# Patient Record
Sex: Male | Born: 1970 | Race: White | Hispanic: No | Marital: Married | Smoking: Current every day smoker
Health system: Southern US, Community
[De-identification: ages and names within clinical notes are randomized; demographics above are authoritative.]

## PROBLEM LIST (undated history)

## (undated) DIAGNOSIS — I10 Essential (primary) hypertension: Secondary | ICD-10-CM

## (undated) HISTORY — DX: Essential (primary) hypertension: I10

## (undated) HISTORY — PX: WISDOM TOOTH EXTRACTION: SHX21

---

## 2003-03-01 ENCOUNTER — Encounter: Payer: Self-pay | Admitting: Internal Medicine

## 2003-03-01 ENCOUNTER — Encounter: Admission: RE | Admit: 2003-03-01 | Discharge: 2003-03-01 | Payer: Self-pay | Admitting: Internal Medicine

## 2003-09-14 ENCOUNTER — Encounter: Admission: RE | Admit: 2003-09-14 | Discharge: 2003-09-14 | Payer: Self-pay | Admitting: Internal Medicine

## 2005-04-08 IMAGING — US US SCROTUM
1 series · 14 of 25 positions shown · non-contrast
Comparison: none

CLINICAL DATA: Testicular pain. 
 ULTRASOUND OF THE SCROTUM
 Scans over the scrotum were performed.  Both testicles are normal in size and in echogenicity.  The right testicle measures 4.6 x 2.4 x 3.2 cm with the left testicle measuring 4.6 x 2.1 x 2.9 cm.  Blood flow is demonstrated to both testicles.  Small cysts are noted in the right epididymis of no more than 4 mm in maximum diameter.  A single small left epididymal cyst is present of 4 mm as well.  A small amount of fluid is noted bilaterally.  No varicocele is seen.
 IMPRESSION
 Small epididymal cysts are noted, right more numerous than left.  Both testicles are normal in size and in echogenicity.

[Series 1: unknown · 0.09mm/px · 14 of 52 slices shown]
[im 1/52]
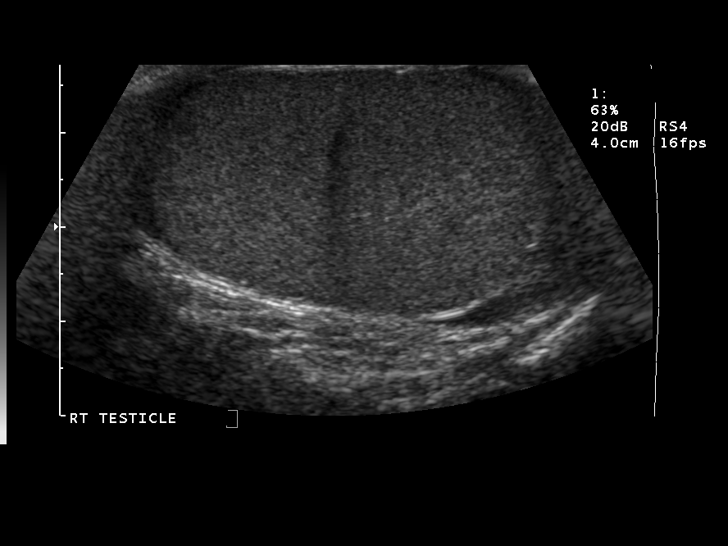
[im 5/52]
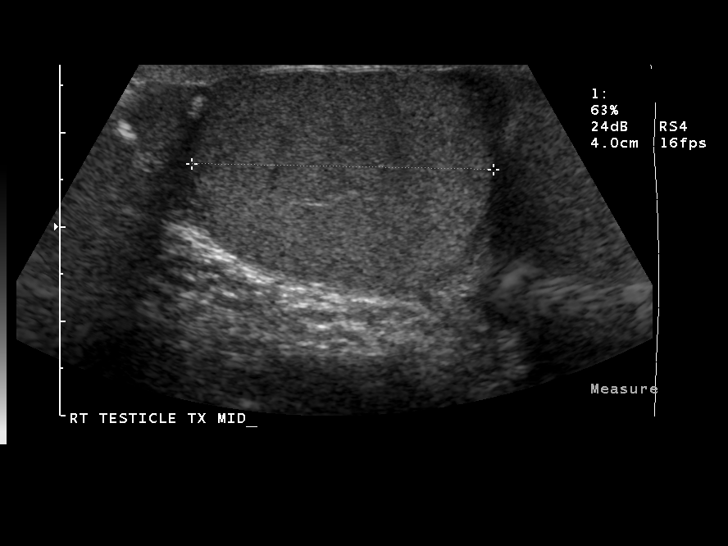
[im 9/52]
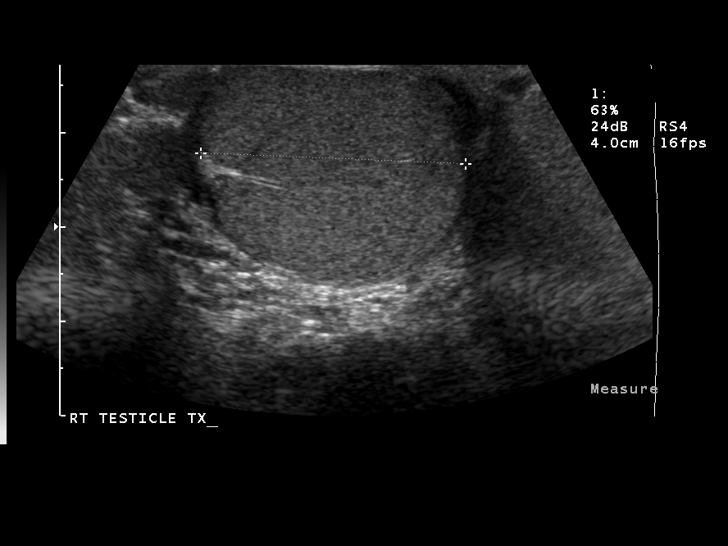
[im 13/52]
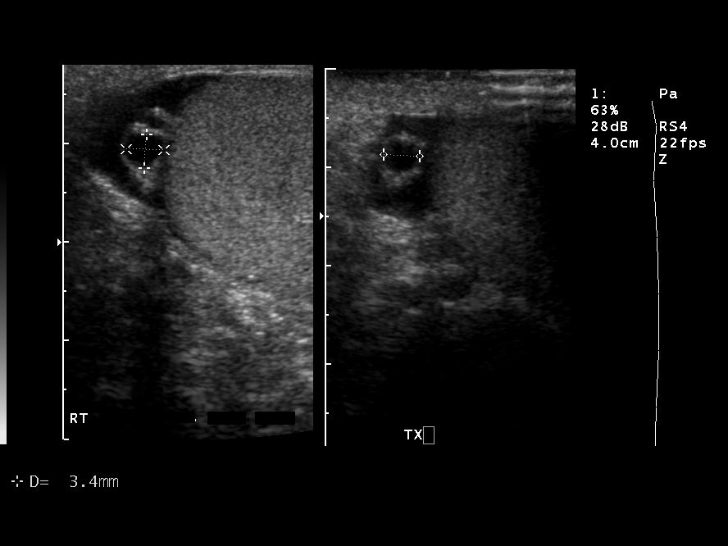
[im 18/52]
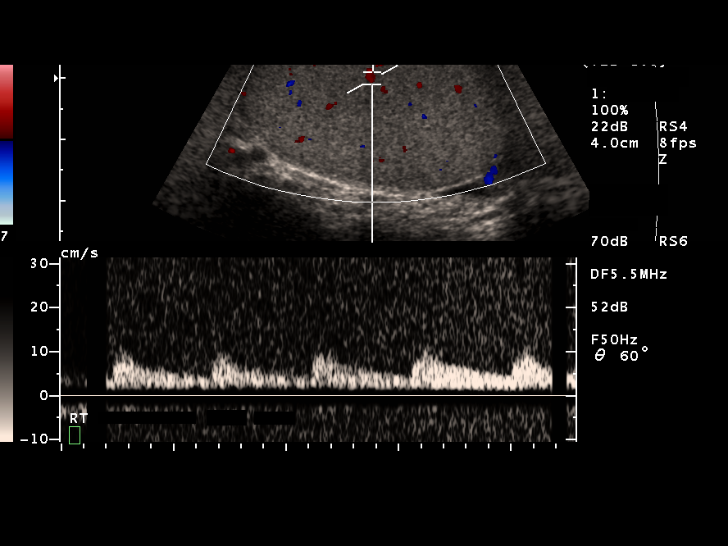
[im 20/52]
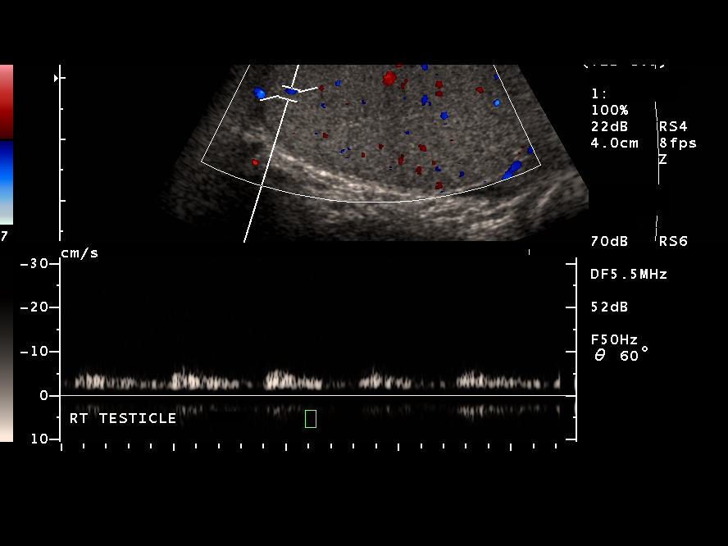
[im 24/52]
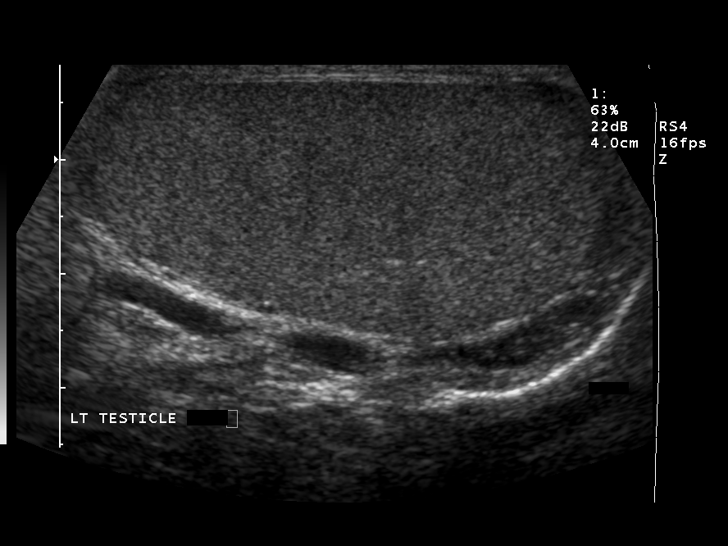
[im 28/52]
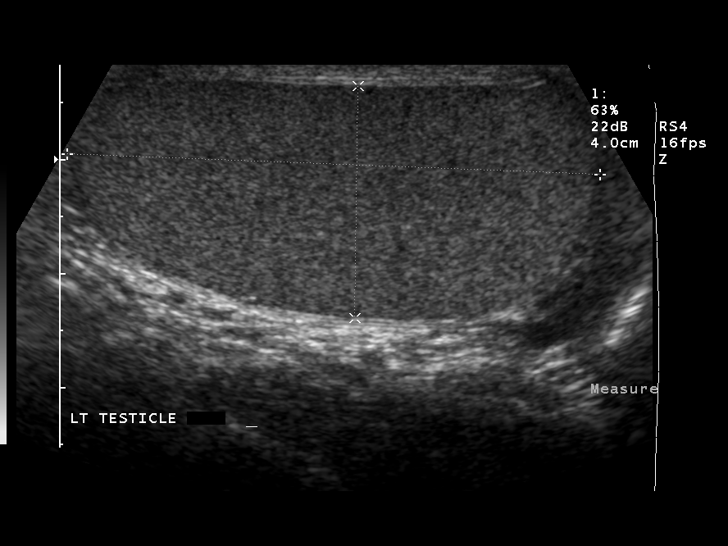
[im 32/52]
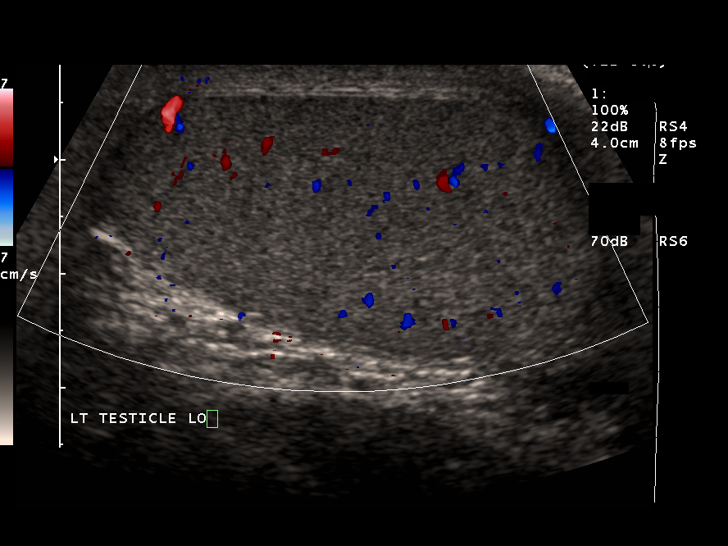
[im 35/52]
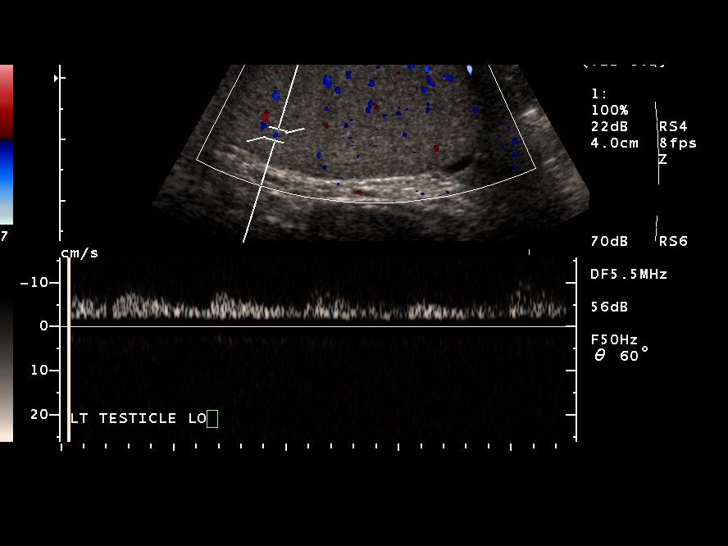
[im 39/52]
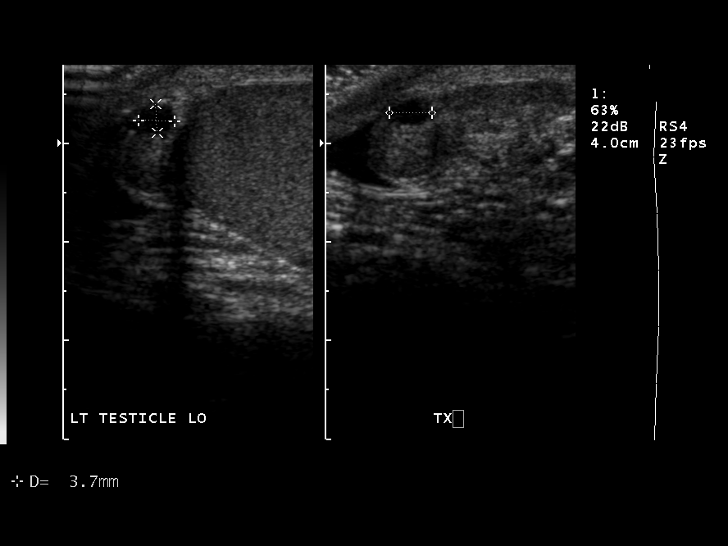
[im 43/52]
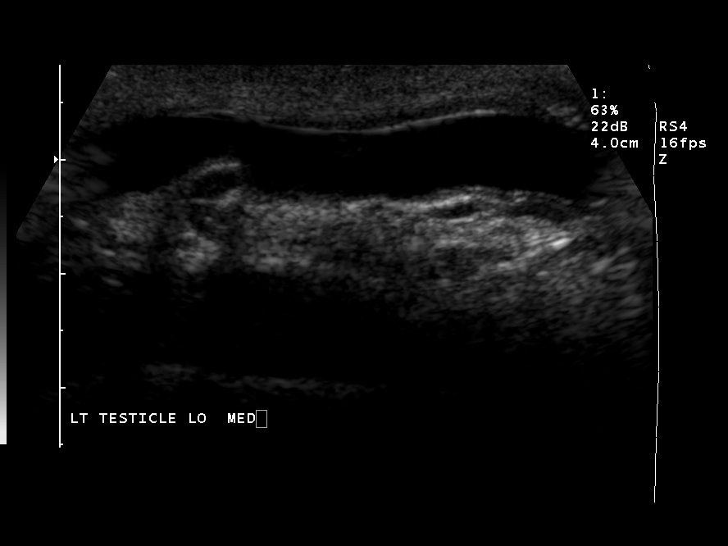
[im 47/52]
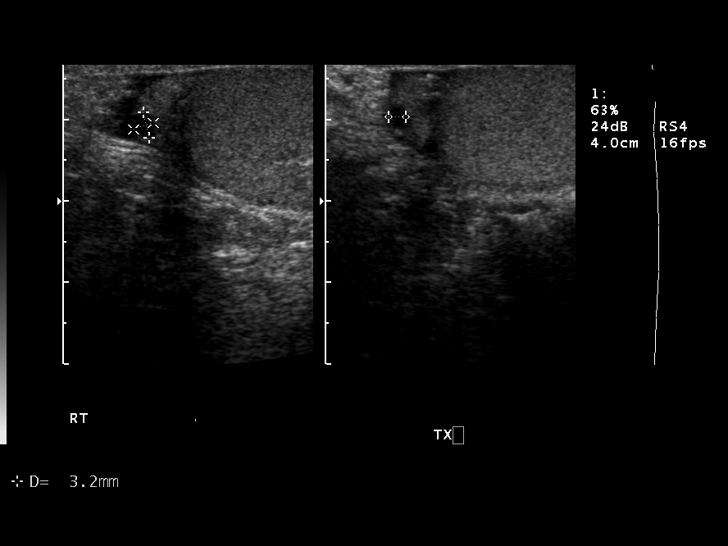
[im 52/52]
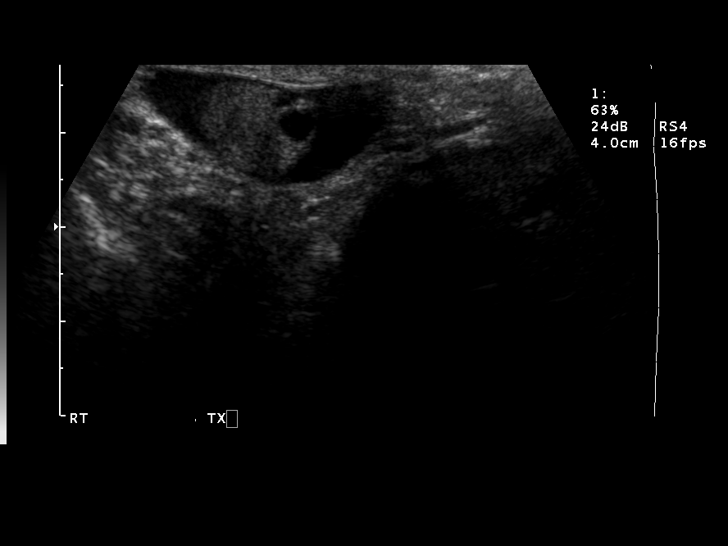

[14 of 25 positions shown; findings below may reference images not displayed]

## 2011-02-26 ENCOUNTER — Ambulatory Visit (HOSPITAL_COMMUNITY)
Admission: RE | Admit: 2011-02-26 | Discharge: 2011-02-26 | Disposition: A | Discharge status: Home / Self Care | Payer: BC Managed Care – PPO | Source: Ambulatory Visit | Attending: Gastroenterology | Admitting: Gastroenterology

## 2011-02-26 ENCOUNTER — Other Ambulatory Visit: Payer: Self-pay | Admitting: Gastroenterology

## 2011-02-26 DIAGNOSIS — D126 Benign neoplasm of colon, unspecified: Secondary | ICD-10-CM | POA: Insufficient documentation

## 2011-02-26 DIAGNOSIS — Z800 Family history of malignant neoplasm of digestive organs: Secondary | ICD-10-CM | POA: Insufficient documentation

## 2011-02-26 DIAGNOSIS — Z1211 Encounter for screening for malignant neoplasm of colon: Secondary | ICD-10-CM | POA: Insufficient documentation

## 2011-02-26 DIAGNOSIS — D129 Benign neoplasm of anus and anal canal: Secondary | ICD-10-CM | POA: Insufficient documentation

## 2011-02-26 DIAGNOSIS — D128 Benign neoplasm of rectum: Secondary | ICD-10-CM | POA: Insufficient documentation

## 2011-02-27 NOTE — Op Note (Signed)
  NAME:  WAH, SABIC.:  [id number]  MEDICAL RECORD NO.:  [id number]  LOCATION:  [facility]                         FACILITY:  [facility]  PHYSICIAN:  Danise Edge, M.D.   DATE OF BIRTH:  [birth year]  DATE OF PROCEDURE: DATE OF DISCHARGE:                              OPERATIVE REPORT   REFERRING PHYSICIAN:  Georgann Housekeeper, MD  HISTORY:  [name] is a 40 year old male, born on Mar 24, 1971. The patient's father was diagnosed with colon cancer under age 62.  The patient is scheduled to undergo his first screening colonoscopy with polypectomy to prevent colon cancer.  ENDOSCOPIST:  Danise Edge, M.D.  PREMEDICATIONS: 1. Fentanyl 100 mcg. 2. Versed 10 mg.  DESCRIPTION OF PROCEDURE:  The patient was placed in the left lateral decubitus position.  Anal inspection and digital rectal exam were normal.  The Pentax pediatric colonoscope was introduced into the rectum, and easily advanced to the cecum.  Normal-appearing ileocecal valve and appendiceal orifice were identified.  Colonic preparation for the exam today was good.  Rectum:  Two 4 mm sessile polyps were removed from the rectum with the cold snare, and submitted for pathological interpretation.  Retroflexed view of the distal rectum was normal.  Sigmoid colon:  Normal.  Descending colon:  From the proximal descending colon, a 3 mm sessile polyp was removed with the cold biopsy forceps.  From the distal descending colon, a 5 mm sessile polyp was removed with the cold snare.  Splenic flexure:  Normal.  Transverse colon:  Normal.  Hepatic flexure:  Normal.  Ascending colon:  Normal.  Cecum and ileocecal valve:  Normal.  ASSESSMENT:  Two small polyps were removed from the rectum, and two small polyps were removed from the descending colon.  All polyps were submitted in 1 bottle for pathological evaluation.  RECOMMENDATIONS:  Repeat colonoscopy in 5 years.     ______________________________ Danise Edge, M.D.     MJ/MEDQ  D:  02/26/2011  T:  02/26/2011  Job:  [id number]  cc:   Georgann Housekeeper, MD Fax: [phone]  Electronically Signed by Danise Edge M.D. on 02/27/2011 04:04:03 PM

## 2016-08-18 DIAGNOSIS — R6889 Other general symptoms and signs: Secondary | ICD-10-CM | POA: Diagnosis not present

## 2016-08-18 DIAGNOSIS — J101 Influenza due to other identified influenza virus with other respiratory manifestations: Secondary | ICD-10-CM | POA: Diagnosis not present

## 2016-10-19 DIAGNOSIS — Z0000 Encounter for general adult medical examination without abnormal findings: Secondary | ICD-10-CM | POA: Diagnosis not present

## 2016-10-19 DIAGNOSIS — I10 Essential (primary) hypertension: Secondary | ICD-10-CM | POA: Diagnosis not present

## 2016-10-19 DIAGNOSIS — Z23 Encounter for immunization: Secondary | ICD-10-CM | POA: Diagnosis not present

## 2016-10-19 DIAGNOSIS — Z1389 Encounter for screening for other disorder: Secondary | ICD-10-CM | POA: Diagnosis not present

## 2016-11-16 DIAGNOSIS — Z86010 Personal history of colonic polyps: Secondary | ICD-10-CM | POA: Diagnosis not present

## 2016-11-16 DIAGNOSIS — I10 Essential (primary) hypertension: Secondary | ICD-10-CM | POA: Diagnosis not present

## 2016-11-16 DIAGNOSIS — Z800 Family history of malignant neoplasm of digestive organs: Secondary | ICD-10-CM | POA: Diagnosis not present

## 2017-04-20 DIAGNOSIS — F17210 Nicotine dependence, cigarettes, uncomplicated: Secondary | ICD-10-CM | POA: Diagnosis not present

## 2017-04-20 DIAGNOSIS — I10 Essential (primary) hypertension: Secondary | ICD-10-CM | POA: Diagnosis not present

## 2017-04-20 DIAGNOSIS — M109 Gout, unspecified: Secondary | ICD-10-CM | POA: Diagnosis not present

## 2017-04-20 DIAGNOSIS — Z23 Encounter for immunization: Secondary | ICD-10-CM | POA: Diagnosis not present

## 2017-10-11 DIAGNOSIS — Z01818 Encounter for other preprocedural examination: Secondary | ICD-10-CM | POA: Diagnosis not present

## 2017-10-11 DIAGNOSIS — Z86010 Personal history of colonic polyps: Secondary | ICD-10-CM | POA: Diagnosis not present

## 2017-10-20 DIAGNOSIS — I10 Essential (primary) hypertension: Secondary | ICD-10-CM | POA: Diagnosis not present

## 2017-10-20 DIAGNOSIS — Z136 Encounter for screening for cardiovascular disorders: Secondary | ICD-10-CM | POA: Diagnosis not present

## 2017-10-20 DIAGNOSIS — F17210 Nicotine dependence, cigarettes, uncomplicated: Secondary | ICD-10-CM | POA: Diagnosis not present

## 2017-10-20 DIAGNOSIS — Z0000 Encounter for general adult medical examination without abnormal findings: Secondary | ICD-10-CM | POA: Diagnosis not present

## 2017-10-20 DIAGNOSIS — Z125 Encounter for screening for malignant neoplasm of prostate: Secondary | ICD-10-CM | POA: Diagnosis not present

## 2017-10-20 DIAGNOSIS — Z1389 Encounter for screening for other disorder: Secondary | ICD-10-CM | POA: Diagnosis not present

## 2017-12-16 DIAGNOSIS — Z86010 Personal history of colonic polyps: Secondary | ICD-10-CM | POA: Diagnosis not present

## 2017-12-16 DIAGNOSIS — D126 Benign neoplasm of colon, unspecified: Secondary | ICD-10-CM | POA: Diagnosis not present

## 2017-12-16 DIAGNOSIS — K635 Polyp of colon: Secondary | ICD-10-CM | POA: Diagnosis not present

## 2017-12-21 DIAGNOSIS — D126 Benign neoplasm of colon, unspecified: Secondary | ICD-10-CM | POA: Diagnosis not present

## 2018-05-31 DIAGNOSIS — F17210 Nicotine dependence, cigarettes, uncomplicated: Secondary | ICD-10-CM | POA: Diagnosis not present

## 2018-05-31 DIAGNOSIS — Z23 Encounter for immunization: Secondary | ICD-10-CM | POA: Diagnosis not present

## 2018-05-31 DIAGNOSIS — I10 Essential (primary) hypertension: Secondary | ICD-10-CM | POA: Diagnosis not present

## 2018-08-08 DIAGNOSIS — Z111 Encounter for screening for respiratory tuberculosis: Secondary | ICD-10-CM | POA: Diagnosis not present

## 2018-08-14 DIAGNOSIS — J069 Acute upper respiratory infection, unspecified: Secondary | ICD-10-CM | POA: Diagnosis not present

## 2018-08-20 DIAGNOSIS — J22 Unspecified acute lower respiratory infection: Secondary | ICD-10-CM | POA: Diagnosis not present

## 2018-08-20 DIAGNOSIS — J0100 Acute maxillary sinusitis, unspecified: Secondary | ICD-10-CM | POA: Diagnosis not present

## 2018-09-05 DIAGNOSIS — K219 Gastro-esophageal reflux disease without esophagitis: Secondary | ICD-10-CM | POA: Diagnosis not present

## 2018-09-05 DIAGNOSIS — R0981 Nasal congestion: Secondary | ICD-10-CM | POA: Diagnosis not present

## 2018-10-25 DIAGNOSIS — I10 Essential (primary) hypertension: Secondary | ICD-10-CM | POA: Diagnosis not present

## 2018-10-25 DIAGNOSIS — F17210 Nicotine dependence, cigarettes, uncomplicated: Secondary | ICD-10-CM | POA: Diagnosis not present

## 2018-10-25 DIAGNOSIS — K219 Gastro-esophageal reflux disease without esophagitis: Secondary | ICD-10-CM | POA: Diagnosis not present

## 2019-07-11 DIAGNOSIS — Z20828 Contact with and (suspected) exposure to other viral communicable diseases: Secondary | ICD-10-CM | POA: Diagnosis not present

## 2019-07-27 DIAGNOSIS — Z20828 Contact with and (suspected) exposure to other viral communicable diseases: Secondary | ICD-10-CM | POA: Diagnosis not present

## 2019-08-01 DIAGNOSIS — M109 Gout, unspecified: Secondary | ICD-10-CM | POA: Diagnosis not present

## 2019-08-01 DIAGNOSIS — Z125 Encounter for screening for malignant neoplasm of prostate: Secondary | ICD-10-CM | POA: Diagnosis not present

## 2019-08-01 DIAGNOSIS — Z13220 Encounter for screening for lipoid disorders: Secondary | ICD-10-CM | POA: Diagnosis not present

## 2019-08-01 DIAGNOSIS — Z0000 Encounter for general adult medical examination without abnormal findings: Secondary | ICD-10-CM | POA: Diagnosis not present

## 2019-08-07 ENCOUNTER — Ambulatory Visit
Admission: RE | Admit: 2019-08-07 | Discharge: 2019-08-07 | Disposition: A | Discharge status: Home / Self Care | Payer: BC Managed Care – PPO | Source: Ambulatory Visit | Attending: Internal Medicine | Admitting: Internal Medicine

## 2019-08-07 ENCOUNTER — Other Ambulatory Visit: Payer: Self-pay | Admitting: Internal Medicine

## 2019-08-07 DIAGNOSIS — F17210 Nicotine dependence, cigarettes, uncomplicated: Secondary | ICD-10-CM | POA: Diagnosis not present

## 2019-09-06 DIAGNOSIS — F17210 Nicotine dependence, cigarettes, uncomplicated: Secondary | ICD-10-CM | POA: Diagnosis not present

## 2019-09-06 DIAGNOSIS — I10 Essential (primary) hypertension: Secondary | ICD-10-CM | POA: Diagnosis not present

## 2019-11-07 DIAGNOSIS — Z3009 Encounter for other general counseling and advice on contraception: Secondary | ICD-10-CM | POA: Diagnosis not present

## 2019-12-19 DIAGNOSIS — Z302 Encounter for sterilization: Secondary | ICD-10-CM | POA: Diagnosis not present

## 2020-01-03 DIAGNOSIS — B079 Viral wart, unspecified: Secondary | ICD-10-CM | POA: Diagnosis not present

## 2020-01-03 DIAGNOSIS — L819 Disorder of pigmentation, unspecified: Secondary | ICD-10-CM | POA: Diagnosis not present

## 2020-01-03 DIAGNOSIS — D485 Neoplasm of uncertain behavior of skin: Secondary | ICD-10-CM | POA: Diagnosis not present

## 2020-02-12 DIAGNOSIS — Z20822 Contact with and (suspected) exposure to covid-19: Secondary | ICD-10-CM | POA: Diagnosis not present

## 2020-03-07 DIAGNOSIS — U071 COVID-19: Secondary | ICD-10-CM | POA: Diagnosis not present

## 2020-03-07 DIAGNOSIS — J069 Acute upper respiratory infection, unspecified: Secondary | ICD-10-CM | POA: Diagnosis not present

## 2020-03-09 ENCOUNTER — Telehealth: Payer: Self-pay | Admitting: Unknown Physician Specialty

## 2020-03-09 ENCOUNTER — Other Ambulatory Visit: Payer: Self-pay | Admitting: Unknown Physician Specialty

## 2020-03-09 DIAGNOSIS — I10 Essential (primary) hypertension: Secondary | ICD-10-CM

## 2020-03-09 DIAGNOSIS — U071 COVID-19: Secondary | ICD-10-CM

## 2020-03-09 NOTE — Telephone Encounter (Signed)
I connected by phone with Brandon Espinoza on 03/09/2020 at 9:39 AM to discuss the potential use of a new treatment for mild to moderate COVID-19 viral infection in non-hospitalized patients.  This patient is a 49 y.o. male that meets the FDA criteria for Emergency Use Authorization of COVID monoclonal antibody casirivimab/imdevimab.  Has a (+) direct SARS-CoV-2 viral test result  Has mild or moderate COVID-19   Is NOT hospitalized due to COVID-19  Is within 10 days of symptom onset  Has at least one of the high risk factor(s) for progression to severe COVID-19 and/or hospitalization as defined in EUA.  Specific high risk criteria : Cardiovascular disease or hypertension   I have spoken and communicated the following to the patient or parent/caregiver regarding COVID monoclonal antibody treatment:  1. FDA has authorized the emergency use for the treatment of mild to moderate COVID-19 in adults and pediatric patients with positive results of direct SARS-CoV-2 viral testing who are 73 years of age and older weighing at least 40 kg, and who are at high risk for progressing to severe COVID-19 and/or hospitalization.  2. The significant known and potential risks and benefits of COVID monoclonal antibody, and the extent to which such potential risks and benefits are unknown.  3. Information on available alternative treatments and the risks and benefits of those alternatives, including clinical trials.  4. Patients treated with COVID monoclonal antibody should continue to self-isolate and use infection control measures (e.g., wear mask, isolate, social distance, avoid sharing personal items, clean and disinfect "high touch" surfaces, and frequent handwashing) according to CDC guidelines.   5. The patient or parent/caregiver has the option to accept or refuse COVID monoclonal antibody treatment.  After reviewing this information with the patient, The patient agreed to proceed with receiving  casirivimab\imdevimab infusion and will be provided a copy of the Fact sheet prior to receiving the infusion. Kathrine Haddock 03/09/2020 9:39 AM Sx onset 8/31

## 2020-03-10 ENCOUNTER — Ambulatory Visit (HOSPITAL_COMMUNITY)
Admission: RE | Admit: 2020-03-10 | Discharge: 2020-03-10 | Disposition: A | Discharge status: Home / Self Care | Payer: BC Managed Care – PPO | Source: Ambulatory Visit | Attending: Pulmonary Disease | Admitting: Pulmonary Disease

## 2020-03-10 DIAGNOSIS — U071 COVID-19: Secondary | ICD-10-CM

## 2020-03-10 DIAGNOSIS — I10 Essential (primary) hypertension: Secondary | ICD-10-CM | POA: Diagnosis not present

## 2020-03-10 MED ORDER — DIPHENHYDRAMINE HCL 50 MG/ML IJ SOLN: 50.0000 mg | Freq: Once | INTRAMUSCULAR | Status: DC | PRN

## 2020-03-10 MED ORDER — ALBUTEROL SULFATE HFA 108 (90 BASE) MCG/ACT IN AERS: 2.0000 | INHALATION_SPRAY | Freq: Once | RESPIRATORY_TRACT | Status: DC | PRN

## 2020-03-10 MED ORDER — METHYLPREDNISOLONE SODIUM SUCC 125 MG IJ SOLR: 125.0000 mg | Freq: Once | INTRAMUSCULAR | Status: DC | PRN

## 2020-03-10 MED ORDER — SODIUM CHLORIDE 0.9 % IV SOLN
1200.0000 mg | Freq: Once | INTRAVENOUS | Status: AC
  Administered 2020-03-10: 1200 mg via INTRAVENOUS
  Filled 2020-03-10: qty 10

## 2020-03-10 MED ORDER — SODIUM CHLORIDE 0.9 % IV SOLN: INTRAVENOUS | Status: DC | PRN

## 2020-03-10 MED ORDER — EPINEPHRINE 0.3 MG/0.3ML IJ SOAJ: 0.3000 mg | Freq: Once | INTRAMUSCULAR | Status: DC | PRN

## 2020-03-10 MED ORDER — FAMOTIDINE IN NACL 20-0.9 MG/50ML-% IV SOLN: 20.0000 mg | Freq: Once | INTRAVENOUS | Status: DC | PRN

## 2020-03-10 NOTE — Discharge Instructions (Signed)

## 2020-03-10 NOTE — Progress Notes (Signed)
  Diagnosis: COVID-19  Physician: Dr. Wright  Procedure: [facility] Med: casirivimab\imdevimab infusion - Provided patient with casirivimab\imdevimab fact sheet for patients, parents and caregivers prior to infusion.  Complications: No immediate complications noted.  Discharge: Discharged home   Coleen  Picard-Tagnolli 03/10/2020   

## 2020-03-14 DIAGNOSIS — Z20822 Contact with and (suspected) exposure to covid-19: Secondary | ICD-10-CM | POA: Diagnosis not present

## 2020-04-24 DIAGNOSIS — I10 Essential (primary) hypertension: Secondary | ICD-10-CM | POA: Diagnosis not present

## 2020-04-24 DIAGNOSIS — Z23 Encounter for immunization: Secondary | ICD-10-CM | POA: Diagnosis not present

## 2020-07-31 DIAGNOSIS — Z0000 Encounter for general adult medical examination without abnormal findings: Secondary | ICD-10-CM | POA: Diagnosis not present

## 2020-07-31 DIAGNOSIS — Z13220 Encounter for screening for lipoid disorders: Secondary | ICD-10-CM | POA: Diagnosis not present

## 2020-07-31 DIAGNOSIS — Z125 Encounter for screening for malignant neoplasm of prostate: Secondary | ICD-10-CM | POA: Diagnosis not present

## 2021-01-28 DIAGNOSIS — F17210 Nicotine dependence, cigarettes, uncomplicated: Secondary | ICD-10-CM | POA: Diagnosis not present

## 2021-01-28 DIAGNOSIS — M109 Gout, unspecified: Secondary | ICD-10-CM | POA: Diagnosis not present

## 2021-01-28 DIAGNOSIS — I10 Essential (primary) hypertension: Secondary | ICD-10-CM | POA: Diagnosis not present

## 2021-02-24 DIAGNOSIS — M25561 Pain in right knee: Secondary | ICD-10-CM | POA: Diagnosis not present

## 2021-02-24 DIAGNOSIS — M25551 Pain in right hip: Secondary | ICD-10-CM | POA: Diagnosis not present

## 2021-02-24 DIAGNOSIS — M6751 Plica syndrome, right knee: Secondary | ICD-10-CM | POA: Diagnosis not present

## 2021-02-24 DIAGNOSIS — S83281A Other tear of lateral meniscus, current injury, right knee, initial encounter: Secondary | ICD-10-CM | POA: Diagnosis not present

## 2021-03-01 IMAGING — DX DG CHEST 2V
2 series · 2 of 2 positions shown · non-contrast
Comparison: Report of CT scan of the chest dated 03/01/2003

CLINICAL DATA: Nicotine dependence, cigarettes, uncomplicated.
[id number]

EXAM:
CHEST - 2 VIEW

[dg chest 2 view (1 of 2)]
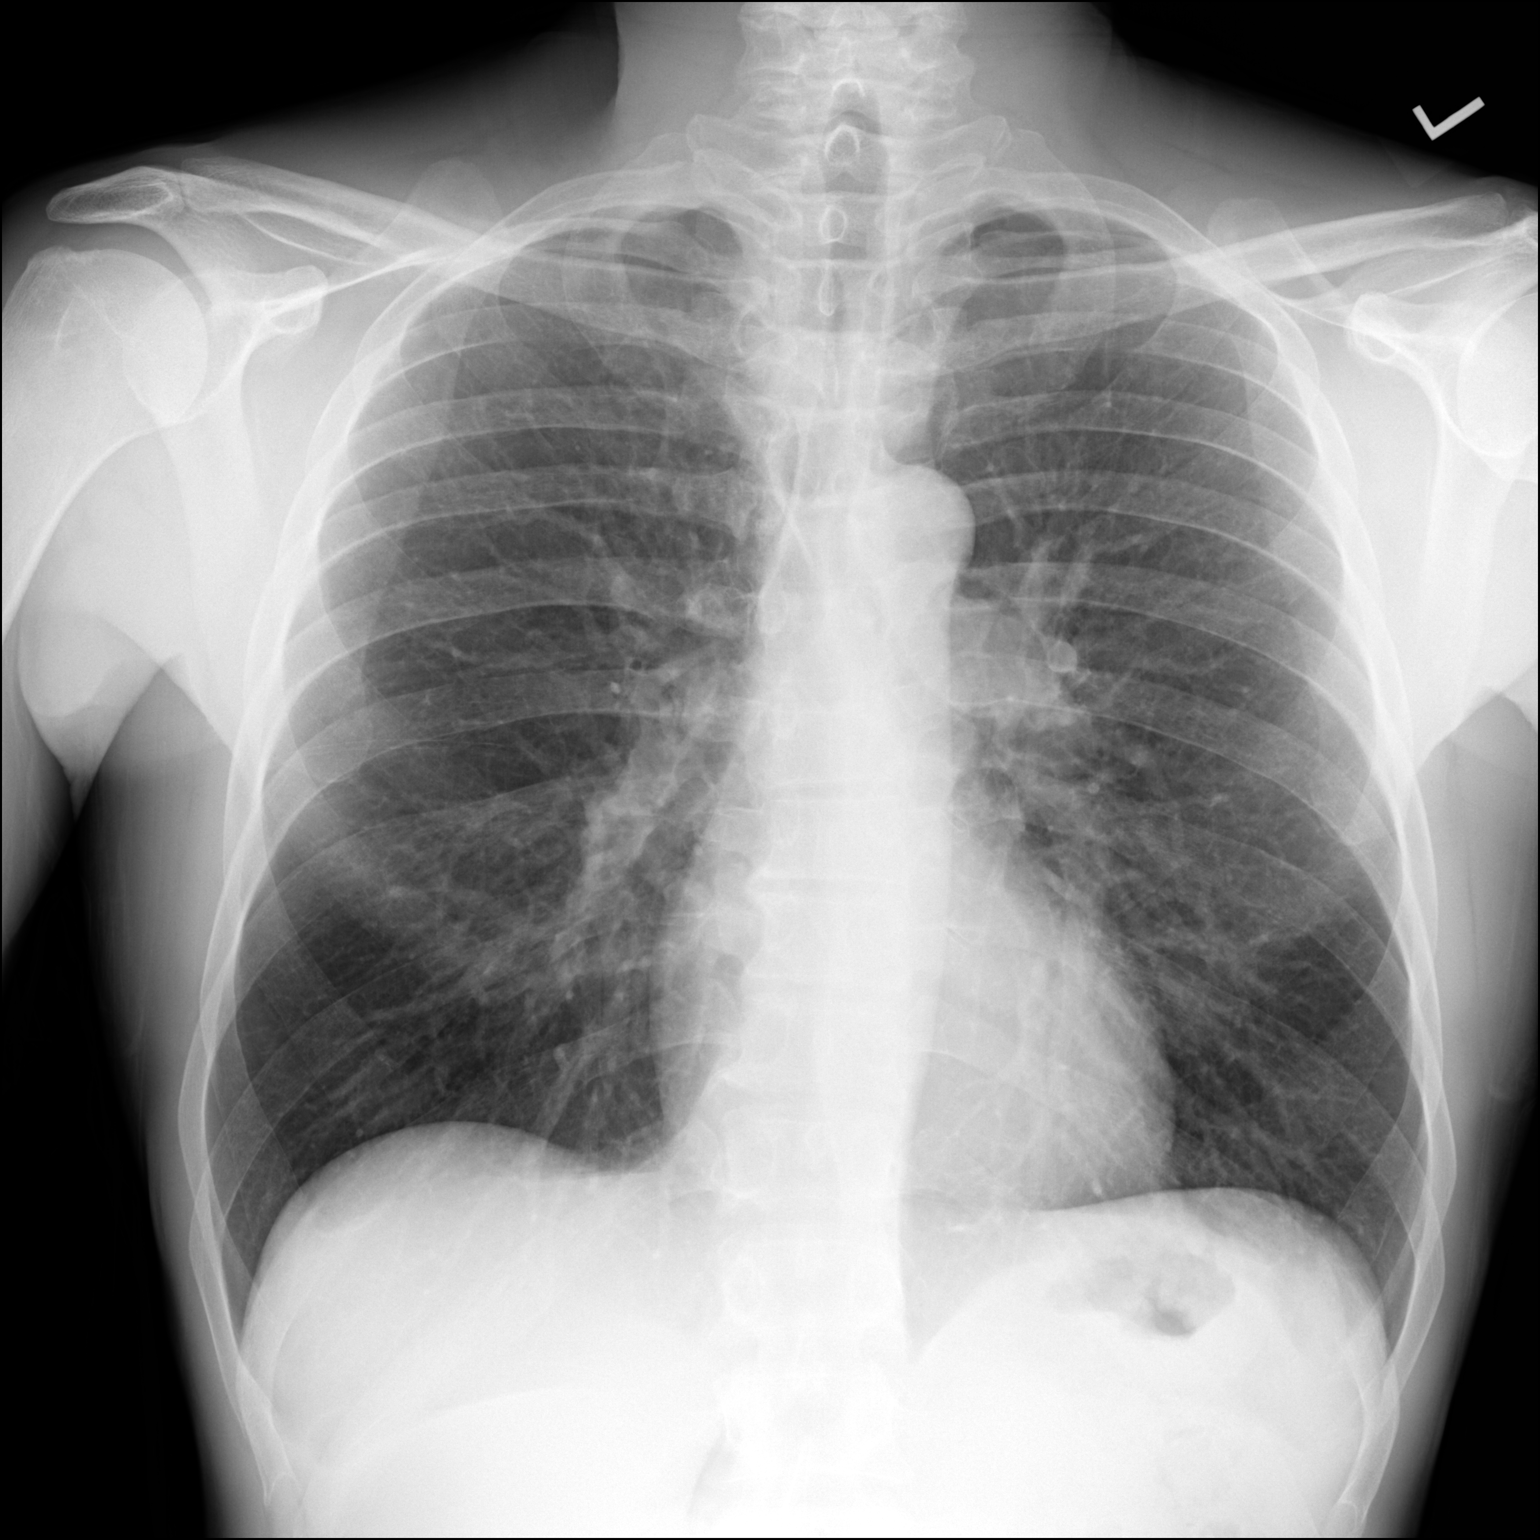

[dg chest 2 view (2 of 2)]
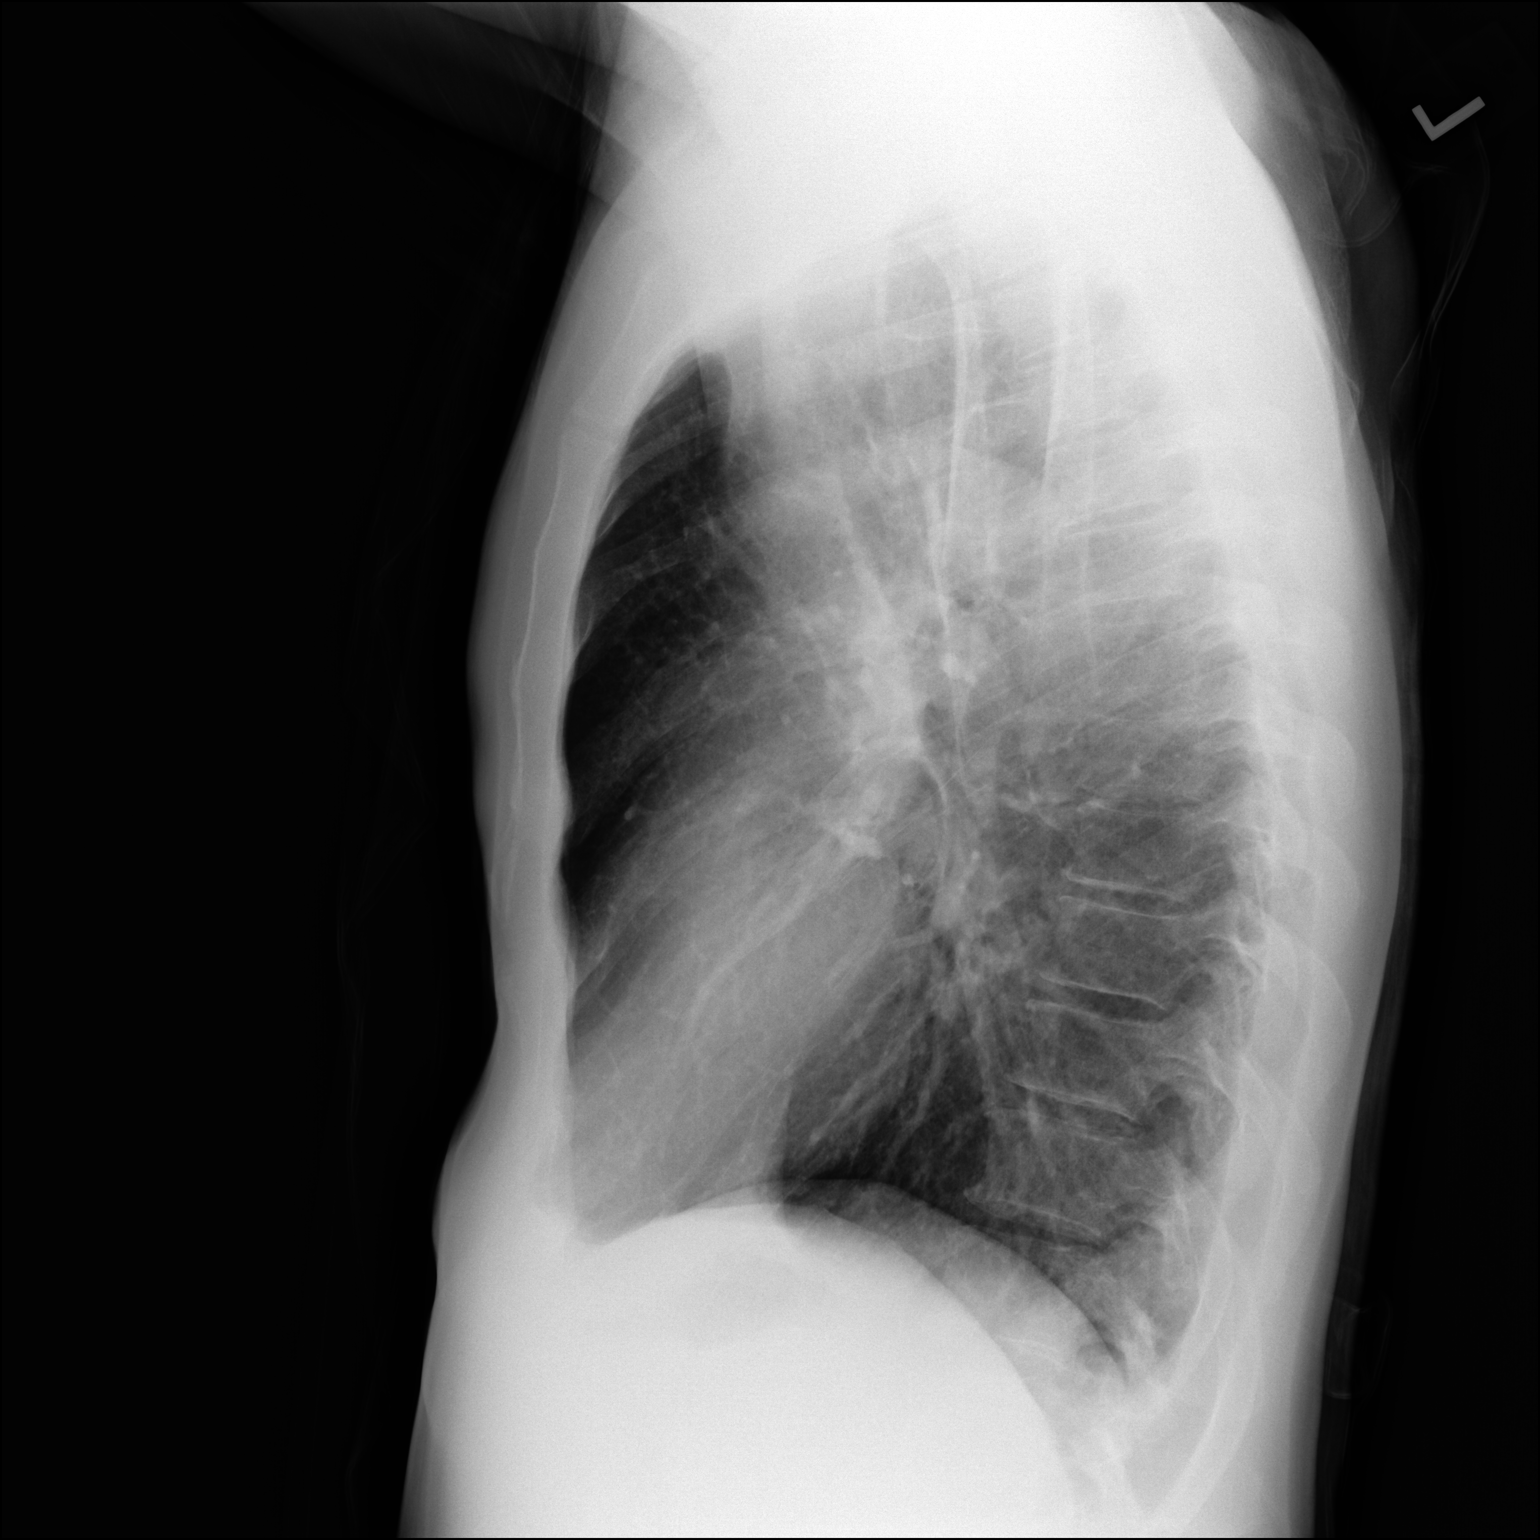

[2 of 2 positions shown; findings below may reference images not displayed]

FINDINGS: The heart size and mediastinal contours are within normal limits.
Both lungs are clear. The visualized skeletal structures are
unremarkable.
IMPRESSION: Normal exam.

## 2021-03-03 DIAGNOSIS — M25551 Pain in right hip: Secondary | ICD-10-CM | POA: Diagnosis not present

## 2021-03-18 DIAGNOSIS — M25561 Pain in right knee: Secondary | ICD-10-CM | POA: Diagnosis not present

## 2021-03-20 DIAGNOSIS — M25561 Pain in right knee: Secondary | ICD-10-CM | POA: Diagnosis not present

## 2021-04-01 DIAGNOSIS — Z86010 Personal history of colonic polyps: Secondary | ICD-10-CM | POA: Diagnosis not present

## 2021-04-01 DIAGNOSIS — D122 Benign neoplasm of ascending colon: Secondary | ICD-10-CM | POA: Diagnosis not present

## 2021-04-03 DIAGNOSIS — M25561 Pain in right knee: Secondary | ICD-10-CM | POA: Diagnosis not present

## 2021-04-09 DIAGNOSIS — Y999 Unspecified external cause status: Secondary | ICD-10-CM | POA: Diagnosis not present

## 2021-04-09 DIAGNOSIS — M948X6 Other specified disorders of cartilage, lower leg: Secondary | ICD-10-CM | POA: Diagnosis not present

## 2021-04-09 DIAGNOSIS — X58XXXA Exposure to other specified factors, initial encounter: Secondary | ICD-10-CM | POA: Diagnosis not present

## 2021-04-09 DIAGNOSIS — S83241A Other tear of medial meniscus, current injury, right knee, initial encounter: Secondary | ICD-10-CM | POA: Diagnosis not present

## 2021-04-09 DIAGNOSIS — S83281A Other tear of lateral meniscus, current injury, right knee, initial encounter: Secondary | ICD-10-CM | POA: Diagnosis not present

## 2021-04-09 DIAGNOSIS — S83271A Complex tear of lateral meniscus, current injury, right knee, initial encounter: Secondary | ICD-10-CM | POA: Diagnosis not present

## 2021-04-09 DIAGNOSIS — G8918 Other acute postprocedural pain: Secondary | ICD-10-CM | POA: Diagnosis not present

## 2021-04-09 DIAGNOSIS — M1711 Unilateral primary osteoarthritis, right knee: Secondary | ICD-10-CM | POA: Diagnosis not present

## 2021-04-09 DIAGNOSIS — S83231A Complex tear of medial meniscus, current injury, right knee, initial encounter: Secondary | ICD-10-CM | POA: Diagnosis not present

## 2021-04-09 DIAGNOSIS — M6751 Plica syndrome, right knee: Secondary | ICD-10-CM | POA: Diagnosis not present

## 2021-04-10 HISTORY — PX: KNEE ARTHROSCOPY: SUR90

## 2021-04-14 DIAGNOSIS — M2241 Chondromalacia patellae, right knee: Secondary | ICD-10-CM | POA: Diagnosis not present

## 2021-04-14 DIAGNOSIS — M6281 Muscle weakness (generalized): Secondary | ICD-10-CM | POA: Diagnosis not present

## 2021-04-14 DIAGNOSIS — R262 Difficulty in walking, not elsewhere classified: Secondary | ICD-10-CM | POA: Diagnosis not present

## 2021-04-14 DIAGNOSIS — M25661 Stiffness of right knee, not elsewhere classified: Secondary | ICD-10-CM | POA: Diagnosis not present

## 2021-04-17 DIAGNOSIS — M25661 Stiffness of right knee, not elsewhere classified: Secondary | ICD-10-CM | POA: Diagnosis not present

## 2021-04-17 DIAGNOSIS — M6281 Muscle weakness (generalized): Secondary | ICD-10-CM | POA: Diagnosis not present

## 2021-04-17 DIAGNOSIS — S83281D Other tear of lateral meniscus, current injury, right knee, subsequent encounter: Secondary | ICD-10-CM | POA: Diagnosis not present

## 2021-04-17 DIAGNOSIS — M2241 Chondromalacia patellae, right knee: Secondary | ICD-10-CM | POA: Diagnosis not present

## 2021-04-21 DIAGNOSIS — M25661 Stiffness of right knee, not elsewhere classified: Secondary | ICD-10-CM | POA: Diagnosis not present

## 2021-04-21 DIAGNOSIS — M6281 Muscle weakness (generalized): Secondary | ICD-10-CM | POA: Diagnosis not present

## 2021-04-21 DIAGNOSIS — M2241 Chondromalacia patellae, right knee: Secondary | ICD-10-CM | POA: Diagnosis not present

## 2021-04-21 DIAGNOSIS — R262 Difficulty in walking, not elsewhere classified: Secondary | ICD-10-CM | POA: Diagnosis not present

## 2021-04-25 DIAGNOSIS — M25661 Stiffness of right knee, not elsewhere classified: Secondary | ICD-10-CM | POA: Diagnosis not present

## 2021-04-25 DIAGNOSIS — M2241 Chondromalacia patellae, right knee: Secondary | ICD-10-CM | POA: Diagnosis not present

## 2021-04-25 DIAGNOSIS — R262 Difficulty in walking, not elsewhere classified: Secondary | ICD-10-CM | POA: Diagnosis not present

## 2021-04-25 DIAGNOSIS — M6281 Muscle weakness (generalized): Secondary | ICD-10-CM | POA: Diagnosis not present

## 2021-04-28 DIAGNOSIS — M25661 Stiffness of right knee, not elsewhere classified: Secondary | ICD-10-CM | POA: Diagnosis not present

## 2021-04-28 DIAGNOSIS — M2241 Chondromalacia patellae, right knee: Secondary | ICD-10-CM | POA: Diagnosis not present

## 2021-04-28 DIAGNOSIS — M6281 Muscle weakness (generalized): Secondary | ICD-10-CM | POA: Diagnosis not present

## 2021-04-28 DIAGNOSIS — R262 Difficulty in walking, not elsewhere classified: Secondary | ICD-10-CM | POA: Diagnosis not present

## 2021-05-02 DIAGNOSIS — M6281 Muscle weakness (generalized): Secondary | ICD-10-CM | POA: Diagnosis not present

## 2021-05-02 DIAGNOSIS — R262 Difficulty in walking, not elsewhere classified: Secondary | ICD-10-CM | POA: Diagnosis not present

## 2021-05-02 DIAGNOSIS — M25661 Stiffness of right knee, not elsewhere classified: Secondary | ICD-10-CM | POA: Diagnosis not present

## 2021-05-02 DIAGNOSIS — M2241 Chondromalacia patellae, right knee: Secondary | ICD-10-CM | POA: Diagnosis not present

## 2021-05-06 DIAGNOSIS — L720 Epidermal cyst: Secondary | ICD-10-CM | POA: Diagnosis not present

## 2021-05-06 DIAGNOSIS — L281 Prurigo nodularis: Secondary | ICD-10-CM | POA: Diagnosis not present

## 2021-05-08 ENCOUNTER — Encounter: Payer: Self-pay | Admitting: Internal Medicine

## 2021-05-08 ENCOUNTER — Other Ambulatory Visit: Payer: Self-pay

## 2021-05-08 ENCOUNTER — Ambulatory Visit (INDEPENDENT_AMBULATORY_CARE_PROVIDER_SITE_OTHER): Payer: BC Managed Care – PPO | Admitting: Internal Medicine

## 2021-05-08 VITALS — BP 117/79 | HR 62 | Resp 12 | Ht 69.5 in | Wt 146.8 lb

## 2021-05-08 DIAGNOSIS — R768 Other specified abnormal immunological findings in serum: Secondary | ICD-10-CM | POA: Diagnosis not present

## 2021-05-08 DIAGNOSIS — M6281 Muscle weakness (generalized): Secondary | ICD-10-CM | POA: Diagnosis not present

## 2021-05-08 DIAGNOSIS — M25432 Effusion, left wrist: Secondary | ICD-10-CM

## 2021-05-08 DIAGNOSIS — Z720 Tobacco use: Secondary | ICD-10-CM | POA: Insufficient documentation

## 2021-05-08 DIAGNOSIS — Z86010 Personal history of colonic polyps: Secondary | ICD-10-CM | POA: Insufficient documentation

## 2021-05-08 DIAGNOSIS — Z800 Family history of malignant neoplasm of digestive organs: Secondary | ICD-10-CM | POA: Insufficient documentation

## 2021-05-08 DIAGNOSIS — K219 Gastro-esophageal reflux disease without esophagitis: Secondary | ICD-10-CM | POA: Insufficient documentation

## 2021-05-08 DIAGNOSIS — Z860101 Personal history of adenomatous and serrated colon polyps: Secondary | ICD-10-CM | POA: Insufficient documentation

## 2021-05-08 DIAGNOSIS — M1A0710 Idiopathic chronic gout, right ankle and foot, without tophus (tophi): Secondary | ICD-10-CM | POA: Diagnosis not present

## 2021-05-08 DIAGNOSIS — I10 Essential (primary) hypertension: Secondary | ICD-10-CM | POA: Insufficient documentation

## 2021-05-08 DIAGNOSIS — M2241 Chondromalacia patellae, right knee: Secondary | ICD-10-CM | POA: Diagnosis not present

## 2021-05-08 DIAGNOSIS — M109 Gout, unspecified: Secondary | ICD-10-CM | POA: Insufficient documentation

## 2021-05-08 DIAGNOSIS — R262 Difficulty in walking, not elsewhere classified: Secondary | ICD-10-CM | POA: Diagnosis not present

## 2021-05-08 DIAGNOSIS — M25661 Stiffness of right knee, not elsewhere classified: Secondary | ICD-10-CM | POA: Diagnosis not present

## 2021-05-08 NOTE — Progress Notes (Signed)
Office Visit Note  Patient: Brandon Espinoza             Date of Birth: January 13, 1971           MRN: [id number]             PCP: Wenda Low, MD Referring: Earlie Server, MD Visit Date: 05/08/2021 Occupation: Construction/Bricklayer  Subjective:   History of Present Illness: ARLIND KLINGERMAN is a 50 y.o. male here for evaluation of positive ANA with chronic history of joint pains.  His biggest issue has been the right knee pain and swelling this was ongoing MRI showing meniscal tears plica and chondrocalcinosis.  He had arthroscopy a few weeks ago with debridement of meniscus and having some improvement subsequently although there is still swelling from the surgery.  Outside of this site he experiences some pain and stiffness frequently in his hands and arms he attributes to heavy use for his occupation.  He had one episode about 2 or 3 months ago of left wrist pain and swelling that started abruptly during the day the pain improved overnight with rest but had swelling lasting several days duration.  He has had gout attacks in the right foot but infrequently and not recently during the problems with his knee and wrist.  He has never taken long-term medication for this problem.  As needed NSAIDs for joint pain.  Work-up demonstrated highly positive ANA otherwise negative rheumatoid factor and inflammatory markers.  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 He notices numbness in his fingers sometimes when driving long distance in the hand or hands holding the steering wheel. He denies lymphadenopathy, oral ulcers, alpecia, raynaud's symptoms, or history of blood clots.  Labs reviewed ANA 1:1280 nucleolar 1:1280 speckled RF neg CRP 0.7  Imaging reviewed 03/18/21 MRI Right Knee Grade 3 oblique tear posterior horn medial meniscus involving the inferior surface Rgade 3 horizontal tear posterior horn and midbody lateral meniscus extending to the free edge  and adjacent surfaces Meniscal and articular cartilage chondrocalcinosis Moderate chondral thinning in the medial compartment and mild chondral thinning in the lateral compartment associated mild marginal spurring. There is also a non-fragmented osteochondral lesion of the medial tibial plateau rim. Small knee joint effusion with thin medial plica. Moderate size baker's cyst.    Review of Systems  Constitutional:  Positive for weight loss.  HENT:  Negative for mouth sores.   Eyes:  Negative for redness.  Respiratory:  Positive for cough. Negative for shortness of breath.   Cardiovascular:  Negative for swelling in legs/feet.  Gastrointestinal:  Negative for blood in stool.  Musculoskeletal:  Positive for joint pain, joint pain and joint swelling. Negative for muscle weakness.  Skin:  Negative for color change.  Neurological:  Positive for numbness.  Hematological:  Negative for swollen glands.   PMFS History:  Patient Active Problem List   Diagnosis Date Noted   Family history of malignant neoplasm of gastrointestinal tract 05/08/2021   Gastroesophageal reflux disease 05/08/2021   Gout 05/08/2021   History of adenomatous polyp of colon 05/08/2021   Hypertension 05/08/2021   Tobacco user 05/08/2021   Positive ANA (antinuclear antibody) 05/08/2021   Wrist swelling, left 05/08/2021    Past Medical History:  Diagnosis Date   Hypertension     Family History  Problem Relation Age of Onset   Heart attack Father    Past Surgical History:  Procedure Laterality Date   KNEE ARTHROSCOPY Right 04/10/2021  WISDOM TOOTH EXTRACTION Bilateral    Social History   Social History Narrative   Not on file   Immunization History  Administered Date(s) Administered   Influenza Split 04/25/2019   Influenza,inj,Quad PF,6+ Mos 04/20/2017, 05/31/2018, 04/24/2020   PFIZER(Purple Top)SARS-COV-2 Vaccination 02/23/2020   Pneumococcal Polysaccharide-23 08/10/2013   Td 10/31/2005   Tdap  10/19/2016     Objective: Vital Signs: BP 117/79 (BP Location: Right Arm, Patient Position: Sitting, Cuff Size: Small)   Pulse 62   Resp 12   Ht 5' 9.5" (1.765 m)   Wt 146 lb 12.8 oz (66.6 kg)   BMI 21.37 kg/m    Physical Exam HENT:     Mouth/Throat:     Mouth: Mucous membranes are moist.     Pharynx: Oropharynx is clear.  Eyes:     Conjunctiva/sclera: Conjunctivae normal.  Cardiovascular:     Rate and Rhythm: Normal rate and regular rhythm.  Pulmonary:     Effort: Pulmonary effort is normal.     Breath sounds: Normal breath sounds.  Musculoskeletal:     Right lower leg: No edema.     Left lower leg: No edema.  Skin:    General: Skin is warm and dry.     Findings: No rash.  Neurological:     Mental Status: He is alert.     Deep Tendon Reflexes: Reflexes normal.  Psychiatric:        Mood and Affect: Mood normal.     Musculoskeletal Exam:  Neck full ROM no tenderness Shoulders full ROM no tenderness or swelling Elbows full ROM no tenderness or swelling Wrists full ROM no tenderness or swelling Fingers heberdon's nodes of DIP joints slight rotation of right 2nd DIP, no tenderness or synovitis Knees right tenderness and swelling, good ROM intact b/l Ankles full ROM no tenderness or swelling    Investigation: No additional findings.  Imaging: No results found.  Recent Labs: No results found for: WBC, HGB, PLT, NA, K, CL, CO2, GLUCOSE, BUN, CREATININE, BILITOT, ALKPHOS, AST, ALT, PROT, ALBUMIN, CALCIUM, GFRAA, QFTBGOLD, QFTBGOLDPLUS  Speciality Comments: No specialty comments available.  Procedures:  No procedures performed Allergies: Patient has no known allergies.   Assessment / Plan:     Visit Diagnoses: Positive ANA (antinuclear antibody) - Plan: Anti-scleroderma antibody, RNP Antibody, Anti-Smith antibody, Sjogrens syndrome-A extractable nuclear antibody, Anti-DNA antibody, double-stranded, C3 and C4  Highly positive ANA titer definitely inflammatory  sounding joint symptoms but minimal changes present on exam today.  He has had some unintentional weight loss as well but no other systemic symptoms.  We will check specific disease antibody tests and serum complement level today.  Idiopathic chronic gout of right foot without tophus - Plan: Uric acid  History of gouty arthritis although has never had flares in recently affected joints before.  Would be beneficial to see him as a large joint effusion recurs for aspiration.  Check uric acid level today.  Wrist swelling, left  Normal-appearing today is isolated use related problem or possibly an episodic inflammatory arthritis.  Orders: Orders Placed This Encounter  Procedures   Anti-scleroderma antibody   RNP Antibody   Anti-Smith antibody   Sjogrens syndrome-A extractable nuclear antibody   Anti-DNA antibody, double-stranded   C3 and C4   Uric acid   No orders of the defined types were placed in this encounter.    Follow-Up Instructions: No follow-ups on file.   Collier Salina, MD  Note - This record has been created using Dragon  software.  Chart creation errors have been sought, but may not always  have been located. Such creation errors do not reflect on  the standard of medical care.

## 2021-05-09 LAB — SJOGRENS SYNDROME-A EXTRACTABLE NUCLEAR ANTIBODY: SSA (Ro) (ENA) Antibody, IgG: <1 AI

## 2021-05-09 LAB — C3 AND C4
C3 Complement: 96 mg/dL (ref 82–185)
C4 Complement: 27 mg/dL (ref 15–53)

## 2021-05-09 LAB — ANTI-SCLERODERMA ANTIBODY: Scleroderma (Scl-70) (ENA) Antibody, IgG: <1 AI

## 2021-05-09 LAB — RNP ANTIBODY: Ribonucleic Protein(ENA) Antibody, IgG: <1 AI

## 2021-05-09 LAB — ANTI-SMITH ANTIBODY: ENA SM Ab Ser-aCnc: <1 AI

## 2021-05-09 LAB — URIC ACID: Uric Acid, Serum: 4.7 mg/dL (ref 4.0–8.0)

## 2021-05-09 LAB — ANTI-DNA ANTIBODY, DOUBLE-STRANDED: ds DNA Ab: <1 IU/mL

## 2021-05-12 DIAGNOSIS — M25661 Stiffness of right knee, not elsewhere classified: Secondary | ICD-10-CM | POA: Diagnosis not present

## 2021-05-12 DIAGNOSIS — R262 Difficulty in walking, not elsewhere classified: Secondary | ICD-10-CM | POA: Diagnosis not present

## 2021-05-12 DIAGNOSIS — M2241 Chondromalacia patellae, right knee: Secondary | ICD-10-CM | POA: Diagnosis not present

## 2021-05-12 DIAGNOSIS — M6281 Muscle weakness (generalized): Secondary | ICD-10-CM | POA: Diagnosis not present

## 2021-05-16 DIAGNOSIS — R262 Difficulty in walking, not elsewhere classified: Secondary | ICD-10-CM | POA: Diagnosis not present

## 2021-05-16 DIAGNOSIS — M2241 Chondromalacia patellae, right knee: Secondary | ICD-10-CM | POA: Diagnosis not present

## 2021-05-16 DIAGNOSIS — M6281 Muscle weakness (generalized): Secondary | ICD-10-CM | POA: Diagnosis not present

## 2021-05-16 DIAGNOSIS — M25661 Stiffness of right knee, not elsewhere classified: Secondary | ICD-10-CM | POA: Diagnosis not present

## 2021-05-25 NOTE — Progress Notes (Signed)
Office Visit Note  Patient: Brandon Espinoza             Date of Birth: January 23, 1971           MRN: [id number]             PCP: Wenda Low, MD Referring: Wenda Low, MD Visit Date: 05/26/2021   Subjective:  Follow-up (Doing good)   History of Present Illness: Brandon Espinoza is a 50 y.o. male here for follow up with joint pains and positive ANA also suspicious for crystalline arthropathy. Lab tests at initial visit we unremarkable for specific ENAs, uric acid, or serum complement. He is feeling well without major complaints no new changes after last visit.  Previous HPI 05/08/21 Brandon Espinoza is a 50 y.o. male here for evaluation of positive ANA with chronic history of joint pains.  His biggest issue has been the right knee pain and swelling this was ongoing MRI showing meniscal tears plica and chondrocalcinosis.  He had arthroscopy a few weeks ago with debridement of meniscus and having some improvement subsequently although there is still swelling from the surgery.  Outside of this site he experiences some pain and stiffness frequently in his hands and arms he attributes to heavy use for his occupation.  He had one episode about 2 or 3 months ago of left wrist pain and swelling that started abruptly during the day the pain improved overnight with rest but had swelling lasting several days duration.  He has had gout attacks in the right foot but infrequently and not recently during the problems with his knee and wrist.  He has never taken long-term medication for this problem.  As needed NSAIDs for joint pain.  Work-up demonstrated highly positive ANA otherwise negative rheumatoid factor and inflammatory markers.  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 He notices numbness in his fingers sometimes when driving long distance in the hand or hands holding the steering wheel. He denies lymphadenopathy, oral ulcers, alpecia, raynaud's symptoms, or  history of blood clots.   Labs reviewed ANA 1:1280 nucleolar 1:1280 speckled RF neg CRP 0.7   Imaging reviewed 03/18/21 MRI Right Knee Grade 3 oblique tear posterior horn medial meniscus involving the inferior surface Rgade 3 horizontal tear posterior horn and midbody lateral meniscus extending to the free edge and adjacent surfaces Meniscal and articular cartilage chondrocalcinosis Moderate chondral thinning in the medial compartment and mild chondral thinning in the lateral compartment associated mild marginal spurring. There is also a non-fragmented osteochondral lesion of the medial tibial plateau rim. Small knee joint effusion with thin medial plica. Moderate size baker's cyst.     Review of Systems  Constitutional:  Negative for fatigue.  HENT:  Negative for mouth dryness.   Eyes:  Negative for dryness.  Respiratory:  Negative for shortness of breath.   Cardiovascular:  Negative for swelling in legs/feet.  Gastrointestinal:  Negative for constipation.  Endocrine: Negative for excessive thirst.  Genitourinary:  Negative for difficulty urinating.  Musculoskeletal:  Negative for morning stiffness.  Skin:  Negative for rash.  Allergic/Immunologic: Negative for susceptible to infections.  Neurological:  Negative for numbness.  Hematological:  Negative for bruising/bleeding tendency.  Psychiatric/Behavioral:  Negative for sleep disturbance.    PMFS History:  Patient Active Problem List   Diagnosis Date Noted   Family history of malignant neoplasm of gastrointestinal tract 05/08/2021   Gastroesophageal reflux disease 05/08/2021   Gout 05/08/2021   History of adenomatous  polyp of colon 05/08/2021   Hypertension 05/08/2021   Tobacco user 05/08/2021   Positive ANA (antinuclear antibody) 05/08/2021   Wrist swelling, left 05/08/2021    Past Medical History:  Diagnosis Date   Hypertension     Family History  Problem Relation Age of Onset   Heart attack Father    Past  Surgical History:  Procedure Laterality Date   KNEE ARTHROSCOPY Right 04/10/2021   WISDOM TOOTH EXTRACTION Bilateral    Social History   Social History Narrative   Not on file   Immunization History  Administered Date(s) Administered   Influenza Split 04/25/2019   Influenza,inj,Quad PF,6+ Mos 04/20/2017, 05/31/2018, 04/24/2020   PFIZER(Purple Top)SARS-COV-2 Vaccination 02/23/2020   Pneumococcal Polysaccharide-23 08/10/2013   Td 10/31/2005   Tdap 10/19/2016     Objective: Vital Signs: BP 137/75 (BP Location: Left Arm, Patient Position: Sitting, Cuff Size: Normal)   Pulse 96   Resp 15   Ht 5\' 10"  (1.778 m)   Wt 147 lb (66.7 kg)   BMI 21.09 kg/m    Physical Exam Skin:    General: Skin is warm and dry.     Findings: No rash.  Neurological:     Mental Status: He is alert.  Psychiatric:        Mood and Affect: Mood normal.     Musculoskeletal Exam:  Wrists full ROM no tenderness or swelling, limited ultrasound inspection of the wrists without obvious swelling and with normal median nerve cross section area Fingers full ROM no tenderness or swelling heberdon's nodes present in multiple sites Knees full ROM no tenderness or swelling Ankles full ROM no tenderness or swelling MTPs full ROM no tenderness or swelling  Investigation: No additional findings.  Imaging: No results found.  Recent Labs: No results found for: WBC, HGB, PLT, NA, K, CL, CO2, GLUCOSE, BUN, CREATININE, BILITOT, ALKPHOS, AST, ALT, PROT, ALBUMIN, CALCIUM, GFRAA, QFTBGOLD, QFTBGOLDPLUS  Speciality Comments: No specialty comments available.  Procedures:  No procedures performed Allergies: Patient has no known allergies.   Assessment / Plan:     Visit Diagnoses: Positive ANA (antinuclear antibody)  Specific antibody tests all negative and no clinical findings on exam again today. Not sure why these are present at such high titer today. I recommended we can follow up next year to take another look  in case preclinical disease if he is noticing any ongoing or worsening symptoms.  Wrist swelling, left  Left wrist looks good again today. He has had ganglion cyst it sounds like in this area. No ultrasound or exam evidence for CTS. 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 Dicussed initial treatment with as needed NSAIDs.   Orders: No orders of the defined types were placed in this encounter.  No orders of the defined types were placed in this encounter.    Follow-Up Instructions: Return in about 1 year (around 05/26/2022) for +ANA/OA/?CPPD f/u 40yr PRN.   Collier Salina, MD  Note - This record has been created using [employer].  Chart creation errors have been sought, but may not always  have been located. Such creation errors do not reflect on  the standard of medical care.

## 2021-05-26 ENCOUNTER — Ambulatory Visit (INDEPENDENT_AMBULATORY_CARE_PROVIDER_SITE_OTHER): Payer: BC Managed Care – PPO | Admitting: Internal Medicine

## 2021-05-26 ENCOUNTER — Other Ambulatory Visit: Payer: Self-pay

## 2021-05-26 ENCOUNTER — Encounter: Payer: Self-pay | Admitting: Internal Medicine

## 2021-05-26 VITALS — BP 137/75 | HR 96 | Resp 15 | Ht 70.0 in | Wt 147.0 lb

## 2021-05-26 DIAGNOSIS — R768 Other specified abnormal immunological findings in serum: Secondary | ICD-10-CM | POA: Diagnosis not present

## 2021-05-26 DIAGNOSIS — M25432 Effusion, left wrist: Secondary | ICD-10-CM

## 2021-05-26 NOTE — Patient Instructions (Signed)
Your specific tests to evaluate the positive ANA were all negative. A minority of people can go on to develop additional signs or symptoms in which case we could follow up to take another look especially if noticing increased problems such as unintentional weight los or more joint inflammation problems.  I don't see evidence of high uric acid to show gout. The chondrocalcinosis, calcium deposition in the cartilage, of your knee joint may be from a type of calcium crystal depositing in the joint problem.  Calcium Pyrophosphate Deposition Calcium pyrophosphate deposition (CPPD) is a type of arthritis that causes pain, swelling, and inflammation in a joint. Attacks of CPPD may come and go. The joint pain can be severe and may last for days to weeks. This condition usually affects one joint at a time. The knees are most often affected, but this condition can also affect the wrists, elbows, shoulders, or ankles. CPPD may also be called pseudogout because it is similar to gout. Both conditions result from the buildup of crystals in a joint. However, CPPD is caused by a type of crystal that is different from the crystals that cause gout. What are the causes? This condition is caused by the buildup of calcium pyrophosphate dihydrate crystals in a joint. The reason why this buildup occurs is not known. An increased likelihood of having this condition (predisposition) may be passed from parent to child (is hereditary). What increases the risk? This condition is more likely to develop in people who: Are older than 51 years of age. Have a family history of CPPD. Have certain medical conditions, such as hemophilia, amyloidosis, or overactive parathyroid glands. Have low levels of magnesium in the blood. What are the signs or symptoms? Symptoms of this condition include: Joint pain. The pain may: Be intense and constant. Develop quickly. Get worse with movement. Last from several days to a few  weeks. Redness, swelling, stiffness, and warmth at the joint. How is this diagnosed? To diagnose this condition, your health care provider will use a needle to remove fluid from the joint. The fluid will be examined for the crystals that cause CPPD. You also may have additional tests, such as: Blood tests. X-rays. Ultrasound. MRI. How is this treated? There is no way to remove the crystals from the joint and no cure for this condition. However, treatment can relieve symptoms and improve joint function. Treatment may include: NSAIDs to reduce inflammation and pain. Removing some of the fluid and crystals from around the joint with a needle. Injections of medicine (cortisone) into the joint to reduce pain and swelling. Medicines to help prevent attacks. Physical therapy to improve joint function. Follow these instructions at home: Managing pain, stiffness, and swelling  Rest the affected joint until your symptoms start to go away. If directed, put ice on the affected area to relieve pain and swelling: Put ice in a plastic bag. Place a towel between your skin and the bag. Leave the ice on for 20 minutes, 2-3 times a day. Keep your affected joint raised (elevated) above the level of your heart, when possible. This will help to reduce swelling. For example, prop your foot up on a chair while sitting down to elevate your knee. General instructions If the painful joint is in your leg, use crutches as told by your health care provider. Take over-the-counter and prescription medicines only as told by your health care provider. When your symptoms start to go away, begin to exercise regularly or do physical therapy. Talk with your  health care provider or physical therapist about what types of exercise are safe for you. Exercise that is easier on your joints (low-impact exercise) may be best. This includes walking, swimming, bicycling, and water aerobics. Maintain a healthy weight. Excess weight puts  stress on your joints. Keep all follow-up visits as told by your health care provider and physical therapist. This is important. Contact a health care provider if you: Notice that your symptoms get worse. Develop a skin rash. Notice that your pain gets worse. Get help right away if you: Have a fever. Have difficulty breathing. Are taking NSAIDs and you: Vomit blood. Have blood in your stool. Have stool that is tarry and black. Summary Calcium pyrophosphate deposition (CPPD) is a type of arthritis that causes pain, swelling, and inflammation in a joint. The knees are most often affected, but it can also affect the wrists, elbows, shoulders, or ankles. CPPD is caused by the buildup of calcium crystals in a joint. The reason why this occurs is not known. Attacks of CPPD may come and go. The joint pain can be severe and may last for days to weeks. There is no way to remove the crystals from the joint and no cure for this condition. However, treatment can relieve symptoms and improve joint function. Rest the affected joint until your symptoms start to go away. After your symptoms go away, begin to exercise regularly or do physical therapy.

## 2021-06-27 DIAGNOSIS — U071 COVID-19: Secondary | ICD-10-CM | POA: Diagnosis not present

## 2021-06-27 DIAGNOSIS — R059 Cough, unspecified: Secondary | ICD-10-CM | POA: Diagnosis not present

## 2021-06-27 DIAGNOSIS — R0981 Nasal congestion: Secondary | ICD-10-CM | POA: Diagnosis not present

## 2021-12-03 DIAGNOSIS — Z13220 Encounter for screening for lipoid disorders: Secondary | ICD-10-CM | POA: Diagnosis not present

## 2021-12-03 DIAGNOSIS — I10 Essential (primary) hypertension: Secondary | ICD-10-CM | POA: Diagnosis not present

## 2021-12-03 DIAGNOSIS — M109 Gout, unspecified: Secondary | ICD-10-CM | POA: Diagnosis not present

## 2021-12-03 DIAGNOSIS — Z125 Encounter for screening for malignant neoplasm of prostate: Secondary | ICD-10-CM | POA: Diagnosis not present

## 2021-12-03 DIAGNOSIS — Z0000 Encounter for general adult medical examination without abnormal findings: Secondary | ICD-10-CM | POA: Diagnosis not present

## 2022-05-12 NOTE — Progress Notes (Deleted)
Office Visit Note  Patient: Brandon Espinoza             Date of Birth: 1971-05-16           MRN: [id number]             PCP: Wenda Low, MD Referring: Wenda Low, MD Visit Date: 05/25/2022   Subjective:  No chief complaint on file.   History of Present Illness: Brandon Espinoza is a 51 y.o. male here for follow up with joint pains and positive ANA also suspicious for crystalline arthropathy   Previous HPI Brandon Espinoza is a 51 y.o. male here for follow up with joint pains and positive ANA also suspicious for crystalline arthropathy. Lab tests at initial visit we unremarkable for specific ENAs, uric acid, or serum complement. He is feeling well without major complaints no new changes after last visit.   Previous HPI 05/08/21 Brandon Espinoza is a 51 y.o. male here for evaluation of positive ANA with chronic history of joint pains.  His biggest issue has been the right knee pain and swelling this was ongoing MRI showing meniscal tears plica and chondrocalcinosis.  He had arthroscopy a few weeks ago with debridement of meniscus and having some improvement subsequently although there is still swelling from the surgery.  Outside of this site he experiences some pain and stiffness frequently in his hands and arms he attributes to heavy use for his occupation.  He had one episode about 2 or 3 months ago of left wrist pain and swelling that started abruptly during the day the pain improved overnight with rest but had swelling lasting several days duration.  He has had gout attacks in the right foot but infrequently and not recently during the problems with his knee and wrist.  He has never taken long-term medication for this problem.  As needed NSAIDs for joint pain.  Work-up demonstrated highly positive ANA otherwise negative rheumatoid factor and inflammatory markers.  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 He notices numbness in his fingers sometimes  when driving long distance in the hand or hands holding the steering wheel. He denies lymphadenopathy, oral ulcers, alpecia, raynaud's symptoms, or history of blood clots.   Labs reviewed ANA 1:1280 nucleolar 1:1280 speckled RF neg CRP 0.7   Imaging reviewed 03/18/21 MRI Right Knee Grade 3 oblique tear posterior horn medial meniscus involving the inferior surface Rgade 3 horizontal tear posterior horn and midbody lateral meniscus extending to the free edge and adjacent surfaces Meniscal and articular cartilage chondrocalcinosis Moderate chondral thinning in the medial compartment and mild chondral thinning in the lateral compartment associated mild marginal spurring. There is also a non-fragmented osteochondral lesion of the medial tibial plateau rim. Small knee joint effusion with thin medial plica. Moderate size baker's cyst.     No Rheumatology ROS completed.   PMFS History:  Patient Active Problem List   Diagnosis Date Noted   Family history of malignant neoplasm of gastrointestinal tract 05/08/2021   Gastroesophageal reflux disease 05/08/2021   Gout 05/08/2021   History of adenomatous polyp of colon 05/08/2021   Hypertension 05/08/2021   Tobacco user 05/08/2021   Positive ANA (antinuclear antibody) 05/08/2021   Wrist swelling, left 05/08/2021    Past Medical History:  Diagnosis Date   Hypertension     Family History  Problem Relation Age of Onset   Heart attack Father    Past Surgical History:  Procedure Laterality Date  KNEE ARTHROSCOPY Right 04/10/2021   WISDOM TOOTH EXTRACTION Bilateral    Social History   Social History Narrative   Not on file   Immunization History  Administered Date(s) Administered   Influenza Split 04/25/2019   Influenza,inj,Quad PF,6+ Mos 04/20/2017, 05/31/2018, 04/24/2020   PFIZER(Purple Top)SARS-COV-2 Vaccination 02/23/2020   Pneumococcal Polysaccharide-23 08/10/2013   Td 10/31/2005   Tdap 10/19/2016     Objective: Vital  Signs: There were no vitals taken for this visit.   Physical Exam   Musculoskeletal Exam: ***  CDAI Exam: CDAI Score: -- Patient Global: --; Provider Global: -- Swollen: --; Tender: -- Joint Exam 05/25/2022   No joint exam has been documented for this visit   There is currently no information documented on the homunculus. Go to the Rheumatology activity and complete the homunculus joint exam.  Investigation: No additional findings.  Imaging: No results found.  Recent Labs: No results found for: "WBC", "HGB", "PLT", "NA", "K", "CL", "CO2", "GLUCOSE", "BUN", "CREATININE", "BILITOT", "ALKPHOS", "AST", "ALT", "PROT", "ALBUMIN", "CALCIUM", "GFRAA", "QFTBGOLD", "QFTBGOLDPLUS"  Speciality Comments: No specialty comments available.  Procedures:  No procedures performed Allergies: Patient has no known allergies.   Assessment / Plan:     Visit Diagnoses: No diagnosis found.  ***  Orders: No orders of the defined types were placed in this encounter.  No orders of the defined types were placed in this encounter.    Follow-Up Instructions: No follow-ups on file.   Bertram Savin, RT  Note - This record has been created using Editor, commissioning.  Chart creation errors have been sought, but may not always  have been located. Such creation errors do not reflect on  the standard of medical care.

## 2022-05-25 ENCOUNTER — Ambulatory Visit: Payer: BC Managed Care – PPO | Admitting: Internal Medicine

## 2022-05-25 DIAGNOSIS — R768 Other specified abnormal immunological findings in serum: Secondary | ICD-10-CM

## 2022-05-25 DIAGNOSIS — M25432 Effusion, left wrist: Secondary | ICD-10-CM

## 2022-06-04 DIAGNOSIS — F17210 Nicotine dependence, cigarettes, uncomplicated: Secondary | ICD-10-CM | POA: Diagnosis not present

## 2022-06-04 DIAGNOSIS — Z23 Encounter for immunization: Secondary | ICD-10-CM | POA: Diagnosis not present

## 2022-06-04 DIAGNOSIS — M109 Gout, unspecified: Secondary | ICD-10-CM | POA: Diagnosis not present

## 2022-06-04 DIAGNOSIS — I10 Essential (primary) hypertension: Secondary | ICD-10-CM | POA: Diagnosis not present

## 2022-09-08 DIAGNOSIS — M25532 Pain in left wrist: Secondary | ICD-10-CM | POA: Diagnosis not present

## 2022-09-10 DIAGNOSIS — M25532 Pain in left wrist: Secondary | ICD-10-CM | POA: Diagnosis not present

## 2022-09-19 DIAGNOSIS — M25532 Pain in left wrist: Secondary | ICD-10-CM | POA: Diagnosis not present

## 2022-09-23 DIAGNOSIS — M25532 Pain in left wrist: Secondary | ICD-10-CM | POA: Diagnosis not present

## 2022-10-12 DIAGNOSIS — M25532 Pain in left wrist: Secondary | ICD-10-CM | POA: Diagnosis not present

## 2022-12-07 ENCOUNTER — Telehealth: Payer: Self-pay | Admitting: Cardiovascular Disease

## 2022-12-07 NOTE — Telephone Encounter (Signed)
Pt's wife states that pt was told by Dr. Excell Seltzer that he would take him on as a NP. She would like a callback regarding this matter. Please advise

## 2022-12-07 NOTE — Telephone Encounter (Signed)
Returned call to patients wife (per verbal permission) who states that Dr Excell Seltzer followed his father who recently passed away and she wants him to be established with cardiology. He's not having any current issues, but does have HTN and current smoker. Scheduled per Excell Seltzer on 12/24/22.

## 2022-12-08 DIAGNOSIS — Z23 Encounter for immunization: Secondary | ICD-10-CM | POA: Diagnosis not present

## 2022-12-08 DIAGNOSIS — Z125 Encounter for screening for malignant neoplasm of prostate: Secondary | ICD-10-CM | POA: Diagnosis not present

## 2022-12-08 DIAGNOSIS — I10 Essential (primary) hypertension: Secondary | ICD-10-CM | POA: Diagnosis not present

## 2022-12-08 DIAGNOSIS — Z13220 Encounter for screening for lipoid disorders: Secondary | ICD-10-CM | POA: Diagnosis not present

## 2022-12-08 DIAGNOSIS — Z0000 Encounter for general adult medical examination without abnormal findings: Secondary | ICD-10-CM | POA: Diagnosis not present

## 2022-12-24 ENCOUNTER — Other Ambulatory Visit: Payer: Self-pay

## 2022-12-24 ENCOUNTER — Encounter: Payer: Self-pay | Admitting: Cardiovascular Disease

## 2022-12-24 ENCOUNTER — Ambulatory Visit: Payer: BC Managed Care – PPO | Attending: Cardiovascular Disease | Admitting: Cardiovascular Disease

## 2022-12-24 VITALS — BP 134/72 | HR 86 | Ht 70.0 in | Wt 145.4 lb

## 2022-12-24 DIAGNOSIS — I10 Essential (primary) hypertension: Secondary | ICD-10-CM | POA: Diagnosis not present

## 2022-12-24 DIAGNOSIS — Z8249 Family history of ischemic heart disease and other diseases of the circulatory system: Secondary | ICD-10-CM | POA: Diagnosis not present

## 2022-12-24 DIAGNOSIS — Z720 Tobacco use: Secondary | ICD-10-CM | POA: Diagnosis not present

## 2022-12-24 NOTE — Patient Instructions (Addendum)
Medication Instructions:  Your physician recommends that you continue on your current medications as directed. Please refer to the Current Medication list given to you today.  *If you need a refill on your cardiac medications before your next appointment, please call your pharmacy*   Testing/Procedures: Stress test Your physician has requested that you have an exercise tolerance test. For further information please visit [email or web address] Please also follow instruction sheet, as given.   Your physician has requested that you have a calcium score CT scan. There is a $99 fee for the scan.      Follow-Up: At [facility], you and your health needs are our priority.  As part of our continuing mission to provide you with exceptional heart care, we have created designated Provider Care Teams.  These Care Teams include your primary Cardiologist (physician) and Advanced Practice Providers (APPs -  Physician Assistants and Nurse Practitioners) who all work together to provide you with the care you need, when you need it.  We recommend signing up for the patient portal called "MyChart".  Sign up information is provided on this After Visit Summary.  MyChart is used to connect with patients for Virtual Visits (Telemedicine).  Patients are able to view lab/test results, encounter notes, upcoming appointments, etc.  Non-urgent messages can be sent to your provider as well.   To learn more about what you can do with MyChart, go to [email or web address].    Your next appointment:   1 year(s)  Provider:   Dr Excell Seltzer

## 2022-12-24 NOTE — Progress Notes (Signed)
Cardiology Office Note:    Date:  12/24/2022   ID:  Brandon Espinoza, DOB [birth year], MRN [id number]  PCP:  Brandon Housekeeper, MD   [facility] Providers Cardiologist:  None     Referring MD: Brandon Housekeeper, MD   Chief Complaint  Patient presents with   Hypertension    Establish cardiology care    History of Present Illness:    Brandon Espinoza is a 52 y.o. male presents for initial cardiac evaluation.  The patient is here today, self-referred for evaluation of cardiovascular risk in the context of family history of CAD.  I cared for the patient's father for many years after he suffered from massive myocardial infarction.  He developed severe mitral regurgitation, refractory ventricular arrhythmias, and advanced heart failure over his many years following his myocardial infarction.  The patient is physically active and he has no personal history of cardiac or coronary problems. Today, he denies symptoms of palpitations, chest pain, shortness of breath, orthopnea, PND, lower extremity edema, dizziness, or syncope.  The patient works in [employer] and does hard physical work without limitation except some mild shortness of breath when he does very heavy work.  He attributes this to cigarette smoking.  States he has been smoking since a teenager.  He has tried quitting in the past and has been off for a few months at 1 time.  He has tried multiple cessation methods including use of Chantix, nicotine gum, and nicotine patches.  All of this has been unsuccessful due to intolerances.  He is treated for hypertension and reports good control of his blood pressure.  His lipids have been followed by his primary care physician and he has not had an excellent lipid panel on no treatment.  His cholesterol is 121, HDL 52, LDL 55.  Past Medical History:  Diagnosis Date   Hypertension     Past Surgical History:  Procedure Laterality Date   KNEE ARTHROSCOPY Right 04/10/2021   WISDOM TOOTH EXTRACTION  Bilateral     Current Medications: Current Meds  Medication Sig   amLODipine (NORVASC) 5 MG tablet Take 5 mg by mouth daily.   ibuprofen (ADVIL) 800 MG tablet Take 800 mg by mouth 3 (three) times daily as needed.   losartan (COZAAR) 100 MG tablet Take 100 mg by mouth daily.     Allergies:   Patient has no known allergies.   Social History   Socioeconomic History   Marital status: Married    Spouse name: Not on file   Number of children: Not on file   Years of education: Not on file   Highest education level: Not on file  Occupational History   Not on file  Tobacco Use   Smoking status: Every Day    Packs/day: 1    Types: Cigarettes   Smokeless tobacco: Current    Types: Chew   Tobacco comments:    Some day user  Vaping Use   Vaping Use: Former  Substance and Sexual Activity   Alcohol use: Yes    Alcohol/week: 2.0 - 4.0 standard drinks of alcohol    Types: 2 - 4 Cans of beer per week   Drug use: Not Currently   Sexual activity: Not on file  Other Topics Concern   Not on file  Social History Narrative   Not on file   Social Determinants of Health   Financial Resource Strain: Not on file  Food Insecurity: Not on file  Transportation Needs: Not on  file  Physical Activity: Not on file  Stress: Not on file  Social Connections: Not on file     Family History: The patient's family history includes Heart attack in his father.  ROS:   Please see the history of present illness.    All other systems reviewed and are negative.  EKGs/Labs/Other Studies Reviewed:    The following studies were reviewed today:     EKG Interpretation  Date/Time:    Ventricular Rate:    PR Interval:    QRS Duration:   QT Interval:    QTC Calculation:   R Axis:     Text Interpretation:         EKG shows NSR with RSR' in V1 suggestive of RV conduction delay, otherwise normal      Recent Labs: No results found for requested labs within last 365 days.  Recent Lipid  Panel No results found for: "CHOL", "TRIG", "HDL", "CHOLHDL", "VLDL", "LDLCALC", "LDLDIRECT"   Risk Assessment/Calculations:                Physical Exam:    VS:  BP 134/72   Pulse 86   Ht 5\' 10"  (1.778 m)   Wt 145 lb 6.4 oz (66 kg)   SpO2 98%   BMI 20.86 kg/m     Wt Readings from Last 3 Encounters:  12/24/22 145 lb 6.4 oz (66 kg)  05/26/21 147 lb (66.7 kg)  05/08/21 146 lb 12.8 oz (66.6 kg)     GEN:  Well nourished, well developed in no acute distress HEENT: Normal NECK: No JVD; No carotid bruits LYMPHATICS: No lymphadenopathy CARDIAC: RRR, no murmurs, rubs, gallops RESPIRATORY:  Clear to auscultation without rales, wheezing or rhonchi  ABDOMEN: Soft, non-tender, non-distended MUSCULOSKELETAL:  No edema; No deformity  SKIN: Warm and dry NEUROLOGIC:  Alert and oriented x 3 PSYCHIATRIC:  Normal affect   ASSESSMENT:    1. Primary hypertension   2. Tobacco user   3. Family history of coronary arteriosclerosis    PLAN:    In order of problems listed above:  Blood pressure well-controlled on a combination of amlodipine and losartan.  Labs reviewed with a creatinine of 1.0 and potassium 4.3.  Appears to be well-managed by his primary care physician. Cessation counseling done today.  Does not appear to be ready to quit.  Extensive discussion about how this is the most powerful risk factor for coronary artery disease, MI, and other health problems moving forward. I have recommended a coronary CT calcium score and an exercise treadmill stress study for further risk stratification.  I would like to see the patient back in 1 year for follow-up evaluation.      Informed Consent   Shared Decision Making/Informed Consent The risks [chest pain, shortness of breath, cardiac arrhythmias, dizziness, blood pressure fluctuations, myocardial infarction, stroke/transient ischemic attack, and life-threatening complications (estimated to be 1 in 10,000)], benefits (risk  stratification, diagnosing coronary artery disease, treatment guidance) and alternatives of an exercise tolerance test were discussed in detail with [name] and he agrees to proceed.       Medication Adjustments/Labs and Tests Ordered: Current medicines are reviewed at length with the patient today.  Concerns regarding medicines are outlined above.  Orders Placed This Encounter  Procedures   CT CARDIAC SCORING (SELF PAY ONLY)   Cardiac Stress Test: Informed Consent Details: Physician/Practitioner Attestation; Transcribe to consent form and obtain patient signature   EXERCISE TOLERANCE TEST (ETT)   EXERCISE TOLERANCE TEST (ETT)  EKG 12-Lead   No orders of the defined types were placed in this encounter.   Patient Instructions  Medication Instructions:  Your physician recommends that you continue on your current medications as directed. Please refer to the Current Medication list given to you today.  *If you need a refill on your cardiac medications before your next appointment, please call your pharmacy*   Testing/Procedures: Stress test Your physician has requested that you have an exercise tolerance test. For further information please visit [email or web address] Please also follow instruction sheet, as given.   Your physician has requested that you have a calcium score CT scan. There is a $99 fee for the scan.      Follow-Up: At [facility], you and your health needs are our priority.  As part of our continuing mission to provide you with exceptional heart care, we have created designated Provider Care Teams.  These Care Teams include your primary Cardiologist (physician) and Advanced Practice Providers (APPs -  Physician Assistants and Nurse Practitioners) who all work together to provide you with the care you need, when you need it.  We recommend signing up for the patient portal called "MyChart".  Sign up information is provided on this After Visit Summary.  MyChart  is used to connect with patients for Virtual Visits (Telemedicine).  Patients are able to view lab/test results, encounter notes, upcoming appointments, etc.  Non-urgent messages can be sent to your provider as well.   To learn more about what you can do with MyChart, go to [email or web address].    Your next appointment:   1 year(s)  Provider:   Dr Excell Seltzer     Signed, Tonny Bollman, MD  12/24/2022 1:34 PM    [facility]

## 2023-01-13 ENCOUNTER — Ambulatory Visit (INDEPENDENT_AMBULATORY_CARE_PROVIDER_SITE_OTHER): Payer: BC Managed Care – PPO

## 2023-01-13 ENCOUNTER — Ambulatory Visit
Admission: RE | Admit: 2023-01-13 | Discharge: 2023-01-13 | Disposition: A | Discharge status: Home / Self Care | Payer: BC Managed Care – PPO | Source: Ambulatory Visit | Attending: Cardiovascular Disease | Admitting: Cardiovascular Disease

## 2023-01-13 ENCOUNTER — Encounter (HOSPITAL_COMMUNITY): Payer: BC Managed Care – PPO

## 2023-01-13 DIAGNOSIS — I10 Essential (primary) hypertension: Secondary | ICD-10-CM

## 2023-01-13 DIAGNOSIS — Z720 Tobacco use: Secondary | ICD-10-CM | POA: Insufficient documentation

## 2023-01-13 DIAGNOSIS — Z8249 Family history of ischemic heart disease and other diseases of the circulatory system: Secondary | ICD-10-CM | POA: Diagnosis not present

## 2023-01-15 LAB — EXERCISE TOLERANCE TEST
Angina Index: 0
Base ST Depression (mm): 0 mm
Duke Treadmill Score: 11
Estimated workload: 13.4
Exercise duration (min): 11 min
Exercise duration (sec): 0 s
MPHR: 167 {beats}/min
Peak HR: 169 {beats}/min
Percent HR: 101 %
RPE: 17
Rest HR: 72 {beats}/min
ST Depression (mm): 0 mm

## 2023-01-20 ENCOUNTER — Encounter: Payer: Self-pay | Admitting: Cardiovascular Disease

## 2023-01-25 ENCOUNTER — Telehealth: Payer: Self-pay

## 2023-01-25 DIAGNOSIS — R931 Abnormal findings on diagnostic imaging of heart and coronary circulation: Secondary | ICD-10-CM

## 2023-01-25 DIAGNOSIS — Z8249 Family history of ischemic heart disease and other diseases of the circulatory system: Secondary | ICD-10-CM

## 2023-01-25 NOTE — Telephone Encounter (Signed)
-----   Message from Tonny Bollman sent at 01/24/2023  4:36 PM EDT ----- Would check baseline cholesterol panel and CMET. If LDL > 70 mg/dL, would add rosuvastatin 10 mg daily in setting of elevated coronary Ca score and family hx of MI. Tobacco cessation is imperative! thx

## 2023-01-25 NOTE — Telephone Encounter (Signed)
Called and spoke with patient who is agreeable to plan laid out by Dr Excell Seltzer. Labs entered and scheduled for 01/29/23.

## 2023-01-29 ENCOUNTER — Ambulatory Visit: Payer: BC Managed Care – PPO | Attending: Cardiovascular Disease

## 2023-01-29 DIAGNOSIS — R931 Abnormal findings on diagnostic imaging of heart and coronary circulation: Secondary | ICD-10-CM

## 2023-01-29 DIAGNOSIS — Z8249 Family history of ischemic heart disease and other diseases of the circulatory system: Secondary | ICD-10-CM | POA: Diagnosis not present

## 2023-02-01 ENCOUNTER — Telehealth: Payer: Self-pay

## 2023-02-01 DIAGNOSIS — E7800 Pure hypercholesterolemia, unspecified: Secondary | ICD-10-CM

## 2023-02-01 DIAGNOSIS — Z79899 Other long term (current) drug therapy: Secondary | ICD-10-CM

## 2023-02-01 MED ORDER — ROSUVASTATIN CALCIUM 10 MG PO TABS
10.0000 mg | ORAL_TABLET | Freq: Every day | ORAL | 3 refills | Status: DC
Start: 2023-02-01 — End: 2023-03-10

## 2023-02-01 NOTE — Telephone Encounter (Signed)
-----   Message from Tonny Bollman sent at 02/01/2023  5:32 AM EDT ----- Cholesterol is actually quite good at baseline. However, with strong risk profile, and LDL > 70, would start rosuvastatin 10 mg daily and repeat labs in 3-4 months. thx

## 2023-02-01 NOTE — Telephone Encounter (Signed)
Called and spoke with patient who agrees with plan to start Crestor 10mg  every day. Medication sent to pharmacy, labs entered and scheduled for 05/06/23.

## 2023-03-08 ENCOUNTER — Encounter: Payer: Self-pay | Admitting: Cardiovascular Disease

## 2023-03-10 MED ORDER — ATORVASTATIN CALCIUM 10 MG PO TABS: ORAL_TABLET | ORAL | 3 refills | Status: DC

## 2023-03-10 NOTE — Telephone Encounter (Signed)
Spoke with patient concerning Dr Earmon Phoenix recommendation to change Crestor to Lipitor. Patient verbalized understanding. No further needs at this time. Sent in Rx to pharmacy  Tonny Bollman, MD  Cordie Grice DealYesterday (2:59 PM)   Lysbeth Penner - let's try atorvastatin 10 mg, but just take on Mon, Wed, and Fri for a total of 3 days/week. If you still have side effects we'll consider a completely different type of medication. I'll ask Maralyn Sago to call this in for you.   Thx and hope you and your family are doing well. Dr Salena Saner

## 2023-05-06 ENCOUNTER — Ambulatory Visit: Payer: BC Managed Care – PPO | Attending: Cardiovascular Disease

## 2023-05-06 DIAGNOSIS — E7800 Pure hypercholesterolemia, unspecified: Secondary | ICD-10-CM | POA: Diagnosis not present

## 2023-05-06 DIAGNOSIS — Z79899 Other long term (current) drug therapy: Secondary | ICD-10-CM

## 2023-05-07 LAB — LIPID PANEL
Chol/HDL Ratio: 3.6 ratio (ref 0.0–5.0)
Cholesterol, Total: 132 mg/dL (ref 100–199)
HDL: 37 mg/dL — ABNORMAL LOW (ref >39)
LDL Chol Calc (NIH): 57 mg/dL (ref 0–99)
Triglycerides: 236 mg/dL — ABNORMAL HIGH (ref 0–149)
VLDL Cholesterol Cal: 38 mg/dL (ref 5–40)

## 2023-05-07 LAB — ALT: ALT: 13 [IU]/L (ref 0–44)

## 2023-05-18 DIAGNOSIS — R051 Acute cough: Secondary | ICD-10-CM | POA: Diagnosis not present

## 2023-05-18 DIAGNOSIS — R0981 Nasal congestion: Secondary | ICD-10-CM | POA: Diagnosis not present

## 2023-05-18 DIAGNOSIS — J392 Other diseases of pharynx: Secondary | ICD-10-CM | POA: Diagnosis not present

## 2023-05-18 DIAGNOSIS — R5383 Other fatigue: Secondary | ICD-10-CM | POA: Diagnosis not present

## 2023-06-05 ENCOUNTER — Telehealth: Payer: Self-pay | Admitting: Physician Assistant

## 2023-06-05 ENCOUNTER — Encounter: Payer: Self-pay | Admitting: Cardiovascular Disease

## 2023-06-05 ENCOUNTER — Encounter (HOSPITAL_COMMUNITY): Payer: Self-pay

## 2023-06-05 DIAGNOSIS — I10 Essential (primary) hypertension: Secondary | ICD-10-CM | POA: Diagnosis not present

## 2023-06-05 DIAGNOSIS — I214 Non-ST elevation (NSTEMI) myocardial infarction: Secondary | ICD-10-CM | POA: Diagnosis not present

## 2023-06-05 DIAGNOSIS — R079 Chest pain, unspecified: Secondary | ICD-10-CM | POA: Diagnosis not present

## 2023-06-05 NOTE — Telephone Encounter (Signed)
Patient's wife paged after hour answering service as the patient is currently admitted at [facility] with NSTEMI.  First troponin 132, second troponin 119.  Patient's wife was initially requesting a transfer to [facility].  I discussed the case with DOD Dr. Wyline Mood, since [facility] initiated the workup and already plans to cath the patient on Monday, we would prefer he stays at [facility] to finish the workup and treatment.   Patient is wife requested Dr. Excell Seltzer to be updated on his current hospitalization.

## 2023-06-06 DIAGNOSIS — F17200 Nicotine dependence, unspecified, uncomplicated: Secondary | ICD-10-CM | POA: Diagnosis not present

## 2023-06-06 DIAGNOSIS — R001 Bradycardia, unspecified: Secondary | ICD-10-CM | POA: Diagnosis not present

## 2023-06-06 DIAGNOSIS — I214 Non-ST elevation (NSTEMI) myocardial infarction: Secondary | ICD-10-CM | POA: Diagnosis not present

## 2023-06-06 DIAGNOSIS — R079 Chest pain, unspecified: Secondary | ICD-10-CM | POA: Diagnosis not present

## 2023-06-06 DIAGNOSIS — Z79899 Other long term (current) drug therapy: Secondary | ICD-10-CM | POA: Diagnosis not present

## 2023-06-06 DIAGNOSIS — I10 Essential (primary) hypertension: Secondary | ICD-10-CM | POA: Diagnosis not present

## 2023-06-06 DIAGNOSIS — E782 Mixed hyperlipidemia: Secondary | ICD-10-CM | POA: Diagnosis not present

## 2023-06-06 DIAGNOSIS — I119 Hypertensive heart disease without heart failure: Secondary | ICD-10-CM | POA: Diagnosis not present

## 2023-06-06 DIAGNOSIS — I25110 Atherosclerotic heart disease of native coronary artery with unstable angina pectoris: Secondary | ICD-10-CM | POA: Diagnosis not present

## 2023-06-06 DIAGNOSIS — Z8249 Family history of ischemic heart disease and other diseases of the circulatory system: Secondary | ICD-10-CM | POA: Diagnosis not present

## 2023-06-06 NOTE — Telephone Encounter (Signed)
Thx. Called his wife, Selena Batten, and left message on her voicemail. Agree with recommendations of Azalee Course, PA-C, as documented.

## 2023-06-22 DIAGNOSIS — I10 Essential (primary) hypertension: Secondary | ICD-10-CM | POA: Diagnosis not present

## 2023-06-22 DIAGNOSIS — M109 Gout, unspecified: Secondary | ICD-10-CM | POA: Diagnosis not present

## 2023-06-22 DIAGNOSIS — I2510 Atherosclerotic heart disease of native coronary artery without angina pectoris: Secondary | ICD-10-CM | POA: Diagnosis not present

## 2023-06-22 DIAGNOSIS — Z95820 Peripheral vascular angioplasty status with implants and grafts: Secondary | ICD-10-CM | POA: Diagnosis not present

## 2023-06-23 ENCOUNTER — Telehealth: Payer: Self-pay

## 2023-06-23 NOTE — Telephone Encounter (Signed)
Patient confirmed Medication and Appointment

## 2023-06-24 ENCOUNTER — Ambulatory Visit: Payer: BC Managed Care – PPO | Attending: Cardiovascular Disease | Admitting: Cardiovascular Disease

## 2023-06-24 ENCOUNTER — Encounter: Payer: Self-pay | Admitting: Cardiovascular Disease

## 2023-06-24 VITALS — BP 116/80 | HR 90 | Ht 71.0 in | Wt 145.6 lb

## 2023-06-24 DIAGNOSIS — I214 Non-ST elevation (NSTEMI) myocardial infarction: Secondary | ICD-10-CM | POA: Diagnosis not present

## 2023-06-24 DIAGNOSIS — Z720 Tobacco use: Secondary | ICD-10-CM

## 2023-06-24 DIAGNOSIS — E7800 Pure hypercholesterolemia, unspecified: Secondary | ICD-10-CM | POA: Diagnosis not present

## 2023-06-24 DIAGNOSIS — I10 Essential (primary) hypertension: Secondary | ICD-10-CM | POA: Diagnosis not present

## 2023-06-24 DIAGNOSIS — Z79899 Other long term (current) drug therapy: Secondary | ICD-10-CM | POA: Diagnosis not present

## 2023-06-24 MED ORDER — NITROGLYCERIN 0.4 MG SL SUBL: SUBLINGUAL_TABLET | SUBLINGUAL | 3 refills | Status: AC

## 2023-06-24 MED ORDER — LOSARTAN POTASSIUM 50 MG PO TABS: 50.0000 mg | ORAL_TABLET | Freq: Every day | ORAL | 3 refills | Status: DC

## 2023-06-24 NOTE — Patient Instructions (Signed)
Medication Instructions:  DECREASE Losartan to 50mg  daily *If you need a refill on your cardiac medications before your next appointment, please call your pharmacy*   Lab Work: Lipids, liver in 2 months If you have labs (blood work) drawn today and your tests are completely normal, you will receive your results only by: MyChart Message (if you have MyChart) OR A paper copy in the mail If you have any lab test that is abnormal or we need to change your treatment, we will call you to review the results.  Follow-Up: At [facility], you and your health needs are our priority.  As part of our continuing mission to provide you with exceptional heart care, we have created designated Provider Care Teams.  These Care Teams include your primary Cardiologist (physician) and Advanced Practice Providers (APPs -  Physician Assistants and Nurse Practitioners) who all work together to provide you with the care you need, when you need it.  Your next appointment:   November 2025  Provider:   Tonny Bollman, MD

## 2023-06-24 NOTE — Progress Notes (Unsigned)
Cardiology Office Note:    Date:  06/24/2023   ID:  Brandon Espinoza, DOB 22-Aug-1970, MRN [id number]  PCP:  Brandon Housekeeper, MD   [facility] Providers Cardiologist:  None     Referring MD: Brandon Housekeeper, MD   Chief Complaint  Patient presents with   Coronary Artery Disease    History of Present Illness:    Brandon Espinoza is a 52 y.o. male with a hx of ***   Current Medications: Current Meds  Medication Sig   amLODipine (NORVASC) 5 MG tablet Take 5 mg by mouth daily.   aspirin 81 MG chewable tablet 1 tablet Orally Once a day for 30 day(s)   atorvastatin (LIPITOR) 80 MG tablet Oral for 90 Days   losartan (COZAAR) 100 MG tablet Take 100 mg by mouth daily.   prasugrel (EFFIENT) 10 MG TABS tablet 1tablet Oral daily for 30 days     Allergies:   Patient has no known allergies.   ROS:   Please see the history of present illness.    *** All other systems reviewed and are negative.  EKGs/Labs/Other Studies Reviewed:    The following studies were reviewed today: Cardiac Studies & Procedures     STRESS TESTS  EXERCISE TOLERANCE TEST (ETT) 01/13/2023  Narrative   A Bruce protocol stress test was performed. Exercise capacity was excellent. Patient exercised for 11 min and 0 sec. Maximum HR of 169 bpm. MPHR 101.0%. Peak METS 13.4. The patient experienced no angina during the test. Hypertensive blood pressure and normal heart rate response noted during stress. Heart rate recovery was normal.   No ST deviation was noted during exercise but there was 0.71mm of horizontal ST depression in the inferolateral leads in recovery. ECG was interpretable and conclusive. The ECG was negative for ischemia.   This is a low risk study.     CT SCANS  CT CARDIAC SCORING (SELF PAY ONLY) 01/13/2023  Addendum 01/18/2023  4:59 PM ADDENDUM REPORT: 01/18/2023 16:56  EXAM: OVER-READ INTERPRETATION  CT CHEST  The following report is an over-read performed by radiologist Dr. Noe Espinoza  [facility] on 01/18/2023. This over-read does not include interpretation of cardiac or coronary anatomy or pathology. The coronary calcium score interpretation by the cardiologist is attached.  COMPARISON:  None.  FINDINGS: Heart is normal size. Aorta normal caliber. Scattered calcifications in the aortic root and aortic valve. No adenopathy. No confluent airspace opacities or effusions. No acute findings in the upper abdomen. Chest wall soft tissues are unremarkable. No acute bony abnormality.  IMPRESSION: No acute extra cardiac abnormality.  Calcifications in the aortic root and aortic valve.   Electronically Signed By: Charlett Nose M.D. On: 01/18/2023 16:56  Narrative CLINICAL DATA:  Cardiovascular Disease Risk stratification  EXAM: Coronary Calcium Score  TECHNIQUE: A gated, non-contrast computed tomography scan of the heart was performed using 3mm slice thickness. Axial images were analyzed on a dedicated workstation. Calcium scoring of the coronary arteries was performed using the Agatston method.  FINDINGS: Coronary arteries: Normal origins.  Coronary Calcium Score:  Left main: 0  Left anterior descending artery: 64.7  Left circumflex artery: 0  Right coronary artery: 2.64  Total: 67.4  Percentile: 79th  Pericardium: Normal.  Ascending Aorta: Normal caliber.  Non-cardiac: See separate report from [facility].  IMPRESSION: Coronary calcium score of 67.4. This was 79th percentile for age-, race-, and sex-matched controls.  RECOMMENDATIONS: Coronary artery calcium (CAC) score is a strong predictor of incident  coronary heart disease (CHD) and provides predictive information beyond traditional risk factors. CAC scoring is reasonable to use in the decision to withhold, postpone, or initiate statin therapy in intermediate-risk or selected borderline-risk asymptomatic adults (age 5-75 years and LDL-C >=70 to <190 mg/dL) who  do not have diabetes or established atherosclerotic cardiovascular disease (ASCVD).* In intermediate-risk (10-year ASCVD risk >=7.5% to <20%) adults or selected borderline-risk (10-year ASCVD risk >=5% to <7.5%) adults in whom a CAC score is measured for the purpose of making a treatment decision the following recommendations have been made:  If CAC=0, it is reasonable to withhold statin therapy and reassess in 5 to 10 years, as long as higher risk conditions are absent (diabetes mellitus, family history of premature CHD in first degree relatives (males <55 years; females <65 years), cigarette smoking, or LDL >=190 mg/dL).  If CAC is 1 to 99, it is reasonable to initiate statin therapy for patients >=71 years of age.  If CAC is >=100 or >=75th percentile, it is reasonable to initiate statin therapy at any age.  Cardiology referral should be considered for patients with CAC scores >=400 or >=75th percentile.  *2018 AHA/ACC/AACVPR/AAPA/ABC/ACPM/ADA/AGS/APhA/ASPC/NLA/PCNA Guideline on the Management of Blood Cholesterol: A Report of the American College of Cardiology/American Heart Association Task Force on Clinical Practice Guidelines. J Am Coll Cardiol. 2019;73(24):3168-3209.  Brandon Magic, MD  Electronically Signed: By: Brandon Espinoza M.D. On: 01/13/2023 13:47          EKG:        Recent Labs: 01/29/2023: BUN 11; Creatinine, Ser 0.96; Potassium 3.9; Sodium 139 05/06/2023: ALT 13  Recent Lipid Panel    Component Value Date/Time   CHOL 132 05/06/2023 0758   TRIG 236 (H) 05/06/2023 0758   HDL 37 (L) 05/06/2023 0758   CHOLHDL 3.6 05/06/2023 0758   LDLCALC 57 05/06/2023 0758     Risk Assessment/Calculations:   {Does this patient have ATRIAL FIBRILLATION?:[phone]}            Physical Exam:    VS:  BP 116/80   Pulse 90   Ht 5\' 11"  (1.803 m)   Wt 145 lb 9.6 oz (66 kg)   SpO2 99%   BMI 20.31 kg/m     Wt Readings from Last 3 Encounters:  06/24/23 145 lb  9.6 oz (66 kg)  12/24/22 145 lb 6.4 oz (66 kg)  05/26/21 147 lb (66.7 kg)     GEN: *** Well nourished, well developed in no acute distress HEENT: Normal NECK: No JVD; No carotid bruits LYMPHATICS: No lymphadenopathy CARDIAC: ***RRR, no murmurs, rubs, gallops RESPIRATORY:  Clear to auscultation without rales, wheezing or rhonchi  ABDOMEN: Soft, non-tender, non-distended MUSCULOSKELETAL:  No edema; No deformity  SKIN: Warm and dry NEUROLOGIC:  Alert and oriented x 3 PSYCHIATRIC:  Normal affect   Assessment & Plan        {Are you ordering a CV Procedure (e.g. stress test, cath, DCCV, TEE, etc)?   Press F2        :[id number]}    Medication Adjustments/Labs and Tests Ordered: Current medicines are reviewed at length with the patient today.  Concerns regarding medicines are outlined above.  No orders of the defined types were placed in this encounter.  No orders of the defined types were placed in this encounter.   There are no Patient Instructions on file for this visit.   Signed, Tonny Bollman, MD  06/24/2023 10:56 AM    [facility]

## 2023-07-06 ENCOUNTER — Encounter: Payer: Self-pay | Admitting: Cardiovascular Disease

## 2023-07-18 ENCOUNTER — Encounter: Payer: Self-pay | Admitting: Cardiovascular Disease

## 2023-07-20 NOTE — Telephone Encounter (Signed)
 Per OV note on 06/24/23:  Medication management With his dizziness and somewhat low blood pressure readings, will reduce losartan  to 50 mg daily.  Routing to MD to see if adjustment should be made on Amlodipine since he takes in the AM and symptoms are occurring then, or if we should cut Losartan  more.

## 2023-07-20 NOTE — Telephone Encounter (Signed)
 I think I'd have him stop amlodipine, stay on the current dose of losartan, and follow his blood pressure. thanks

## 2023-07-21 ENCOUNTER — Encounter: Payer: Self-pay | Admitting: Cardiovascular Disease

## 2023-08-01 ENCOUNTER — Encounter: Payer: Self-pay | Admitting: Cardiovascular Disease

## 2023-08-12 MED ORDER — PRASUGREL HCL 10 MG PO TABS: 10.0000 mg | ORAL_TABLET | Freq: Every day | ORAL | 2 refills | Status: DC

## 2023-08-12 NOTE — Telephone Encounter (Signed)
 Per OV note by Wonda on 06/24/23: Non-ST elevation (NSTEMI) myocardial infarction (HCC) Appears to be doing well after PCI with no recurrent angina.  Preserved LV function noted.  Continue aspirin and prasugrel  through 12 months without interruption.  Per MyChart encounter 06/07/23 from pt's wife, stent was placed. Will send refill to pharmacy at this time for Effient .

## 2023-08-30 ENCOUNTER — Other Ambulatory Visit: Payer: Self-pay | Admitting: Cardiovascular Disease

## 2023-08-30 MED ORDER — ATORVASTATIN CALCIUM 80 MG PO TABS: 80.0000 mg | ORAL_TABLET | Freq: Every day | ORAL | 3 refills | Status: AC

## 2023-08-30 NOTE — Telephone Encounter (Signed)
 Called and spoke with wife Cala Bradford to confirm dose of Atorvastatin the patient is using. She confirmed 80mg  daily. Refill sent to pharmacy at this time.

## 2023-08-30 NOTE — Telephone Encounter (Signed)
*  STAT* If patient is at the pharmacy, call can be transferred to refill team.   1. Which medications need to be refilled? (please list name of each medication and dose if known) atorvastatin (LIPITOR) 80 MG tablet    2. Would you like to learn more about the convenience, safety, & potential cost savings by using the [facility]?     3. Are you open to using the [facility] (Type [facility]. ).   4. Which pharmacy/location (including street and city if local pharmacy) is medication to be sent to? [id number] - [place], [place] - [address]    5. Do they need a 30 day or 90 day supply? 90 day

## 2023-08-30 NOTE — Telephone Encounter (Signed)
 Pt is requesting a refill on medication atorvastatin. Please clarify how pt is suppose to be taking medication atorvastatin? Please address

## 2023-10-16 DIAGNOSIS — M25521 Pain in right elbow: Secondary | ICD-10-CM | POA: Diagnosis not present

## 2023-10-16 DIAGNOSIS — M25511 Pain in right shoulder: Secondary | ICD-10-CM | POA: Diagnosis not present

## 2023-10-17 DIAGNOSIS — M25521 Pain in right elbow: Secondary | ICD-10-CM | POA: Diagnosis not present

## 2023-10-21 DIAGNOSIS — M25511 Pain in right shoulder: Secondary | ICD-10-CM | POA: Diagnosis not present

## 2023-10-21 DIAGNOSIS — M25521 Pain in right elbow: Secondary | ICD-10-CM | POA: Diagnosis not present

## 2023-11-24 DIAGNOSIS — Z0000 Encounter for general adult medical examination without abnormal findings: Secondary | ICD-10-CM | POA: Diagnosis not present

## 2023-11-24 DIAGNOSIS — M109 Gout, unspecified: Secondary | ICD-10-CM | POA: Diagnosis not present

## 2023-11-24 DIAGNOSIS — I2510 Atherosclerotic heart disease of native coronary artery without angina pectoris: Secondary | ICD-10-CM | POA: Diagnosis not present

## 2023-11-24 DIAGNOSIS — I10 Essential (primary) hypertension: Secondary | ICD-10-CM | POA: Diagnosis not present

## 2023-11-30 ENCOUNTER — Encounter: Payer: Self-pay | Admitting: Cardiovascular Disease

## 2024-03-14 DIAGNOSIS — M25571 Pain in right ankle and joints of right foot: Secondary | ICD-10-CM | POA: Diagnosis not present

## 2024-03-16 DIAGNOSIS — M25571 Pain in right ankle and joints of right foot: Secondary | ICD-10-CM | POA: Diagnosis not present

## 2024-04-05 DIAGNOSIS — M109 Gout, unspecified: Secondary | ICD-10-CM | POA: Diagnosis not present

## 2024-04-05 DIAGNOSIS — Z95820 Peripheral vascular angioplasty status with implants and grafts: Secondary | ICD-10-CM | POA: Diagnosis not present

## 2024-04-05 DIAGNOSIS — I2510 Atherosclerotic heart disease of native coronary artery without angina pectoris: Secondary | ICD-10-CM | POA: Diagnosis not present

## 2024-04-05 DIAGNOSIS — I10 Essential (primary) hypertension: Secondary | ICD-10-CM | POA: Diagnosis not present

## 2024-05-29 ENCOUNTER — Ambulatory Visit: Attending: Cardiovascular Disease | Admitting: Cardiovascular Disease

## 2024-05-29 ENCOUNTER — Encounter: Payer: Self-pay | Admitting: Cardiovascular Disease

## 2024-05-29 VITALS — BP 128/84 | HR 74 | Ht 71.0 in | Wt 150.8 lb

## 2024-05-29 DIAGNOSIS — Z720 Tobacco use: Secondary | ICD-10-CM | POA: Diagnosis not present

## 2024-05-29 DIAGNOSIS — I2510 Atherosclerotic heart disease of native coronary artery without angina pectoris: Secondary | ICD-10-CM

## 2024-05-29 DIAGNOSIS — I10 Essential (primary) hypertension: Secondary | ICD-10-CM

## 2024-05-29 DIAGNOSIS — E782 Mixed hyperlipidemia: Secondary | ICD-10-CM | POA: Diagnosis not present

## 2024-05-29 NOTE — Patient Instructions (Signed)
 Medication Instructions:  STOP Prasugrel  (Effient )  *If you need a refill on your cardiac medications before your next appointment, please call your pharmacy*  Lab Work: None ordered today. If you have labs (blood work) drawn today and your tests are completely normal, you will receive your results only by: MyChart Message (if you have MyChart) OR A paper copy in the mail If you have any lab test that is abnormal or we need to change your treatment, we will call you to review the results.  Testing/Procedures: None ordered today.  Follow-Up: At [facility], you and your health needs are our priority.  As part of our continuing mission to provide you with exceptional heart care, our providers are all part of one team.  This team includes your primary Cardiologist (physician) and Advanced Practice Providers or APPs (Physician Assistants and Nurse Practitioners) who all work together to provide you with the care you need, when you need it.  Your next appointment:   1 year(s)  Provider:   Ozell Fell, MD

## 2024-05-29 NOTE — Progress Notes (Signed)
 Cardiology Office Note:    Date:  05/29/2024   ID:  Brandon Espinoza, DOB 1971-01-08, MRN [id number]  PCP:  [facility] Other, MD   [facility] Providers Cardiologist:  Ozell Fell, MD     Referring MD: [facility] Other, MD   Chief Complaint  Patient presents with   Coronary Artery Disease    History of Present Illness:    Brandon Espinoza is a 53 y.o. male with a hx of coronary artery disease, presenting for follow-up evaluation.  Patient had a non-STEMI in 2024 and was treated with PCI to proximal LAD with stenting and balloon angioplasty of the diagonal branch.  His procedure was uncomplicated.  He was last seen here in December 2024 at which time he was felt to be doing well.  Last lipids from May 2025 showed an LDL cholesterol 55 and triglycerides of 46.  Total cholesterol is 122 with an HDL of 55.  Patient is here alone today.  He is doing well from a cardiac perspective and denies any chest pain, chest pressure, or shortness of breath.  He does admit to easy bruising since he has been taking aspirin and prasugrel  together.  He quit smoking at the time of his heart attack.  He does use smokeless tobacco.  He is careful with his diet and he maintains an active lifestyle.   Current Medications: Current Meds  Medication Sig   aspirin 81 MG chewable tablet 1 tablet Orally Once a day for 30 day(s)   atorvastatin  (LIPITOR) 80 MG tablet Take 1 tablet (80 mg total) by mouth daily.   losartan  (COZAAR ) 25 MG tablet Take 25 mg by mouth daily.   nitroGLYCERIN  (NITROSTAT ) 0.4 MG SL tablet Dissolve 1 tablet under the tongue every 5 minutes as needed for chest pain. Max of 3 doses, then 911.   [DISCONTINUED] losartan  (COZAAR ) 50 MG tablet Take 1 tablet (50 mg total) by mouth daily.   [DISCONTINUED] prasugrel  (EFFIENT ) 10 MG TABS tablet Take 1 tablet (10 mg total) by mouth daily.     Allergies:   Patient has no known allergies.   ROS:   Please see the history of present illness.    All  other systems reviewed and are negative.  EKGs/Labs/Other Studies Reviewed:    The following studies were reviewed today: Cardiac Studies & Procedures   ______________________________________________________________________________________________   STRESS TESTS  EXERCISE TOLERANCE TEST (ETT) 01/13/2023  Interpretation Summary   A Bruce protocol stress test was performed. Exercise capacity was excellent. Patient exercised for 11 min and 0 sec. Maximum HR of 169 bpm. MPHR 101.0%. Peak METS 13.4. The patient experienced no angina during the test. Hypertensive blood pressure and normal heart rate response noted during stress. Heart rate recovery was normal.   No ST deviation was noted during exercise but there was 0.68mm of horizontal ST depression in the inferolateral leads in recovery. ECG was interpretable and conclusive. The ECG was negative for ischemia.   This is a low risk study.        CT SCANS  CT CARDIAC SCORING (SELF PAY ONLY) 01/13/2023  Addendum 01/18/2023  4:59 PM ADDENDUM REPORT: 01/18/2023 16:56  EXAM: OVER-READ INTERPRETATION  CT CHEST  The following report is an over-read performed by radiologist Dr. Franky Leff [facility] on 01/18/2023. This over-read does not include interpretation of cardiac or coronary anatomy or pathology. The coronary calcium  score interpretation by the cardiologist is attached.  COMPARISON:  None.  FINDINGS: Heart is normal size.  Aorta normal caliber. Scattered calcifications in the aortic root and aortic valve. No adenopathy. No confluent airspace opacities or effusions. No acute findings in the upper abdomen. Chest wall soft tissues are unremarkable. No acute bony abnormality.  IMPRESSION: No acute extra cardiac abnormality.  Calcifications in the aortic root and aortic valve.   Electronically Signed By: Franky Crease M.D. On: 01/18/2023 16:56  Narrative CLINICAL DATA:  Cardiovascular Disease Risk  stratification  EXAM: Coronary Calcium  Score  TECHNIQUE: A gated, non-contrast computed tomography scan of the heart was performed using 3mm slice thickness. Axial images were analyzed on a dedicated workstation. Calcium  scoring of the coronary arteries was performed using the Agatston method.  FINDINGS: Coronary arteries: Normal origins.  Coronary Calcium  Score:  Left main: 0  Left anterior descending artery: 64.7  Left circumflex artery: 0  Right coronary artery: 2.64  Total: 67.4  Percentile: 79th  Pericardium: Normal.  Ascending Aorta: Normal caliber.  Non-cardiac: See separate report from [facility].  IMPRESSION: Coronary calcium  score of 67.4. This was 79th percentile for age-, race-, and sex-matched controls.  RECOMMENDATIONS: Coronary artery calcium  (CAC) score is a strong predictor of incident coronary heart disease (CHD) and provides predictive information beyond traditional risk factors. CAC scoring is reasonable to use in the decision to withhold, postpone, or initiate statin therapy in intermediate-risk or selected borderline-risk asymptomatic adults (age 22-75 years and LDL-C >=70 to <190 mg/dL) who do not have diabetes or established atherosclerotic cardiovascular disease (ASCVD).* In intermediate-risk (10-year ASCVD risk >=7.5% to <20%) adults or selected borderline-risk (10-year ASCVD risk >=5% to <7.5%) adults in whom a CAC score is measured for the purpose of making a treatment decision the following recommendations have been made:  If CAC=0, it is reasonable to withhold statin therapy and reassess in 5 to 10 years, as long as higher risk conditions are absent (diabetes mellitus, family history of premature CHD in first degree relatives (males <55 years; females <65 years), cigarette smoking, or LDL >=190 mg/dL).  If CAC is 1 to 99, it is reasonable to initiate statin therapy for patients >=33 years of age.  If CAC is >=100 or  >=75th percentile, it is reasonable to initiate statin therapy at any age.  Cardiology referral should be considered for patients with CAC scores >=400 or >=75th percentile.  *2018 AHA/ACC/AACVPR/AAPA/ABC/ACPM/ADA/AGS/APhA/ASPC/NLA/PCNA Guideline on the Management of Blood Cholesterol: A Report of the American College of Cardiology/American Heart Association Task Force on Clinical Practice Guidelines. J Am Coll Cardiol. 2019;73(24):3168-3209.  Wilbert Bihari, MD  Electronically Signed: By: Wilbert Bihari M.D. On: 01/13/2023 13:47     ______________________________________________________________________________________________      EKG:   EKG Interpretation Date/Time:  Monday May 29 2024 10:55:37 EST Ventricular Rate:  74 PR Interval:  142 QRS Duration:  90 QT Interval:  374 QTC Calculation: 415 R Axis:   92  Text Interpretation: Normal sinus rhythm Rightward axis When compared with ECG of 24-Dec-2022 08:22, No significant change was found Confirmed by Wonda Sharper [phone]) on 05/29/2024 11:09:06 AM    Recent Labs: No results found for requested labs within last 365 days.  Recent Lipid Panel    Component Value Date/Time   CHOL 132 05/06/2023 0758   TRIG 236 (H) 05/06/2023 0758   HDL 37 (L) 05/06/2023 0758   CHOLHDL 3.6 05/06/2023 0758   LDLCALC 57 05/06/2023 0758     Risk Assessment/Calculations:                Physical Exam:  VS:  BP 128/84 (BP Location: Right Arm, Patient Position: Sitting, Cuff Size: Normal)   Pulse 74   Ht 5' 11 (1.803 m)   Wt 150 lb 12.8 oz (68.4 kg)   SpO2 99%   BMI 21.03 kg/m     Wt Readings from Last 3 Encounters:  05/29/24 150 lb 12.8 oz (68.4 kg)  06/24/23 145 lb 9.6 oz (66 kg)  12/24/22 145 lb 6.4 oz (66 kg)     GEN:  Well nourished, well developed in no acute distress HEENT: Normal NECK: No JVD; No carotid bruits LYMPHATICS: No lymphadenopathy CARDIAC: RRR, no murmurs, rubs, gallops RESPIRATORY:  Clear to  auscultation without rales, wheezing or rhonchi  ABDOMEN: Soft, non-tender, non-distended MUSCULOSKELETAL:  No edema; No deformity  SKIN: Warm and dry NEUROLOGIC:  Alert and oriented x 3 PSYCHIATRIC:  Normal affect   Assessment & Plan Primary hypertension Continue losartan .  Blood pressure controlled. Tobacco user Cessation counseling done. Coronary artery disease involving native coronary artery of native heart without angina pectoris Patient 12 months out from ACS/PCI.  Stop prasugrel .  Continue aspirin 81 mg daily. Mixed hyperlipidemia Treated with atorvastatin  80 mg daily.  Lipids at goal with LDL cholesterol 55 mg/dL.  Continue current management.            Medication Adjustments/Labs and Tests Ordered: Current medicines are reviewed at length with the patient today.  Concerns regarding medicines are outlined above.  Orders Placed This Encounter  Procedures   EKG 12-Lead   No orders of the defined types were placed in this encounter.   Patient Instructions  Medication Instructions:  STOP Prasugrel  (Effient )  *If you need a refill on your cardiac medications before your next appointment, please call your pharmacy*  Lab Work: None ordered today. If you have labs (blood work) drawn today and your tests are completely normal, you will receive your results only by: MyChart Message (if you have MyChart) OR A paper copy in the mail If you have any lab test that is abnormal or we need to change your treatment, we will call you to review the results.  Testing/Procedures: None ordered today.  Follow-Up: At [facility], you and your health needs are our priority.  As part of our continuing mission to provide you with exceptional heart care, our providers are all part of one team.  This team includes your primary Cardiologist (physician) and Advanced Practice Providers or APPs (Physician Assistants and Nurse Practitioners) who all work together to provide you with  the care you need, when you need it.  Your next appointment:   1 year(s)  Provider:   Ozell Fell, MD     Signed, Ozell Fell, MD  05/29/2024 12:09 PM    [facility]

## 2024-05-29 NOTE — Assessment & Plan Note (Signed)
 Continue losartan.  Blood pressure controlled.

## 2024-05-29 NOTE — Assessment & Plan Note (Signed)
Cessation counseling done 

## 2024-08-14 ENCOUNTER — Ambulatory Visit: Admitting: Cardiovascular Disease
